# Patient Record
Sex: Female | Born: 1950 | Race: Black or African American | Hispanic: No | Marital: Single | Smoking: Former smoker
Health system: Southern US, Community
[De-identification: ages and names within clinical notes are randomized; demographics above are authoritative.]

## PROBLEM LIST (undated history)

## (undated) DIAGNOSIS — M48061 Spinal stenosis, lumbar region without neurogenic claudication: Secondary | ICD-10-CM

## (undated) DIAGNOSIS — I10 Essential (primary) hypertension: Secondary | ICD-10-CM

## (undated) DIAGNOSIS — J4540 Moderate persistent asthma, uncomplicated: Secondary | ICD-10-CM

## (undated) HISTORY — DX: Spinal stenosis, lumbar region without neurogenic claudication: M48.061

## (undated) HISTORY — DX: Essential (primary) hypertension: I10

## (undated) HISTORY — DX: Moderate persistent asthma, uncomplicated: J45.40

## (undated) HISTORY — PX: BUNIONECTOMY: SHX129

---

## 2008-05-01 HISTORY — PX: FOOT ARTHRODESIS, MODIFIED MCBRIDE: SUR52

## 2018-04-16 ENCOUNTER — Encounter: Payer: Self-pay | Admitting: "Endocrinology

## 2018-10-01 ENCOUNTER — Ambulatory Visit: Payer: Self-pay | Admitting: "Endocrinology

## 2018-10-17 ENCOUNTER — Ambulatory Visit (INDEPENDENT_AMBULATORY_CARE_PROVIDER_SITE_OTHER): Payer: Medicare Other | Admitting: "Endocrinology

## 2018-10-17 ENCOUNTER — Encounter: Payer: Self-pay | Admitting: "Endocrinology

## 2018-10-17 ENCOUNTER — Other Ambulatory Visit: Payer: Self-pay

## 2018-10-17 VITALS — BP 152/90 | HR 65 | Temp 97.7°F | Ht 64.5 in | Wt 182.8 lb

## 2018-10-17 DIAGNOSIS — E0590 Thyrotoxicosis, unspecified without thyrotoxic crisis or storm: Secondary | ICD-10-CM | POA: Diagnosis not present

## 2018-10-17 NOTE — Progress Notes (Signed)
Endocrinology Consult Note                                            10/17/2018, 4:35 PM   Subjective:    Patient ID: Lori Ellison, female    DOB: 06/26/1950, PCP Lori HeadingIsernia, James, MD   Past Medical History:  Diagnosis Date  . Hypertension    Past Surgical History:  Procedure Laterality Date  . FOOT ARTHRODESIS, MODIFIED MCBRIDE Bilateral 2010   Social History   Socioeconomic History  . Marital status: Single    Spouse name: Not on file  . Number of children: Not on file  . Years of education: Not on file  . Highest education level: Not on file  Occupational History  . Not on file  Social Needs  . Financial resource strain: Not on file  . Food insecurity    Worry: Not on file    Inability: Not on file  . Transportation needs    Medical: Not on file    Non-medical: Not on file  Tobacco Use  . Smoking status: Not on file  Substance and Sexual Activity  . Alcohol use: Not on file  . Drug use: Not on file  . Sexual activity: Not on file  Lifestyle  . Physical activity    Days per week: Not on file    Minutes per session: Not on file  . Stress: Not on file  Relationships  . Social Musicianconnections    Talks on phone: Not on file    Gets together: Not on file    Attends religious service: Not on file    Active member of club or organization: Not on file    Attends meetings of clubs or organizations: Not on file    Relationship status: Not on file  Other Topics Concern  . Not on file  Social History Narrative  . Not on file   History reviewed. No pertinent family history. Outpatient Encounter Medications as of 10/17/2018  Medication Sig  . amLODipine (NORVASC) 5 MG tablet Take 5 mg by mouth once.  . Calcium-Phosphorus-Vitamin D (CITRACAL +D3 PO) Take 650 mg/kg/day by mouth once.  . Cyanocobalamin (VITAMIN B 12) 500 MCG TABS Take 500 mg by mouth once.  Marland Kitchen. Fexofenadine HCl (ALLEGRA ALLERGY PO) Take 20 mg by mouth once.  . Garlic (GARLIQUE PO) Take 1,000 mg  by mouth once.  . Ginkgo Biloba 40 MG TABS Take 60 mg by mouth 2 (two) times daily.  . Glucosamine-Chondroit-Vit C-Mn (GLUCOSAMINE 1500 COMPLEX PO) Take 2,000 mg by mouth once.  . metoprolol succinate (TOPROL-XL) 25 MG 24 hr tablet Take 25 mg by mouth daily.  Marland Kitchen. omeprazole (PRILOSEC) 20 MG capsule Take 20 mg by mouth 2 (two) times a day.   No facility-administered encounter medications on file as of 10/17/2018.    ALLERGIES: Not on File  VACCINATION STATUS:  There is no immunization history on file for this patient.  HPI Lori Ellison is 68 y.o. female who presents today with a medical history as above. she is being seen in consultation for abnormal thyroid function tests requested by Lori HeadingIsernia, James, MD.  She is known to have abnormal thyroid function test with suppressed TSH at least from October 2019 at which time she was given low-dose of medication what appears to be methimazole briefly.  She has not taken  it for the last several month.  Her most recent thyroid function test from June 24, 2018 which was consistent with TSH still suppressed at 0.038, along with normal free T4 of 0.9 and total T3 of 132. -She denies palpitations, tremors, nor heat/cold intolerance. -She describes fatigue. -She has family history of an identified thyroid dysfunction in her daughter. -She denies any personal history of goiter nor family history of thyroid malignancy. -She denies dysphagia, shortness of breath, nor voice change. Review of Systems  Constitutional: no recent weight gain/loss, no fatigue, no subjective hyperthermia, no subjective hypothermia Eyes: no blurry vision, no xerophthalmia ENT: no sore throat, no nodules palpated in throat, no dysphagia/odynophagia, no hoarseness Cardiovascular: no Chest Pain, no Shortness of Breath, no palpitations, no leg swelling Respiratory: no cough, no shortness of breath Gastrointestinal: no Nausea/Vomiting/Diarhhea Musculoskeletal: no muscle/joint  aches Skin: no rashes Neurological: no tremors, no numbness, no tingling, no dizziness Psychiatric: no depression, no anxiety  Objective:    BP (!) 152/90   Pulse 65   Temp 97.7 F (36.5 C)   Ht 5' 4.5" (1.638 m)   Wt 182 lb 12.8 oz (82.9 kg)   SpO2 98%   BMI 30.89 kg/m   Wt Readings from Last 3 Encounters:  10/17/18 182 lb 12.8 oz (82.9 kg)    Physical Exam  Constitutional:  + Class I obesity, not in acute distress, normal state of mind Eyes: PERRLA, EOMI, no exophthalmos ENT: moist mucous membranes, no gross thyromegaly, no gross cervical lymphadenopathy Cardiovascular: normal precordial activity, Regular Rate and Rhythm, no Murmur/Rubs/Gallops Respiratory:  adequate breathing efforts, no gross chest deformity, Clear to auscultation bilaterally Gastrointestinal: abdomen soft, Non -tender, No distension, Bowel Sounds present, no gross organomegaly Musculoskeletal: no gross deformities, strength intact in all four extremities Skin: moist, warm, no rashes Neurological: no tremor with outstretched hands, Deep tendon reflexes normal in bilateral lower extremities.  -Thyroid function test: February 25, 2018 TSH 0-0 2 5 June 24, 2018 TSH 0038, free T4 0.9, total T3 132  Assessment & Plan:   1. Subclinical hyperthyroidism  - Lori Ellison  is being seen at a kind request of Lonia Mad, MD. - I have reviewed her available thyroid records and clinically evaluated the patient. - Based on these reviews, she has subclinical hyperthyroidism,  however,  there is not sufficient information to proceed with definitive treatment plan. -She will need more complete thyroid function tests today involving TSH, free T4/free T3, and antithyroid antibodies.  She will also need thyroid uptake and scan to complete the work-up. -she will return in 2 weeks to review her repeat labs.   - I did not initiate any new prescriptions today. - I advised her  to maintain close follow up with  Lonia Mad, MD for primary care needs.   - Time spent with the patient: 45 minutes, of which >50% was spent in obtaining information about her symptoms, reviewing her previous labs/studies,  evaluations, and treatments, counseling her about her subclinical hyperthyroidism, and developing a plan to confirm the diagnosis and long term treatment based on the latest standards of care/guidelines.    Aaron Edelman participated in the discussions, expressed understanding, and voiced agreement with the above plans.  All questions were answered to her satisfaction. she is encouraged to contact clinic should she have any questions or concerns prior to her return visit.  Follow up plan: Return in about 2 weeks (around 10/31/2018) for Labs Today- Non-Fasting Ok, Follow up with Thyroid Uptake and Scan.  Marquis LunchGebre Demetre Monaco, MD [facility] [address] [place], [place] [address] Phone: [phone]  Fax: [phone]     10/17/2018, 4:35 PM  This note was partially dictated with voice recognition software. Similar sounding words can be transcribed inadequately or may not  be corrected upon review.

## 2018-10-18 LAB — TSH: TSH: 0.02 mIU/L — ABNORMAL LOW (ref 0.40–4.50)

## 2018-10-18 LAB — T4, FREE: Free T4: 1.2 ng/dL (ref 0.8–1.8)

## 2018-10-18 LAB — T3, FREE: T3, Free: 3.9 pg/mL (ref 2.3–4.2)

## 2018-10-18 LAB — THYROGLOBULIN ANTIBODY: Thyroglobulin Ab: <1 IU/mL (ref <=1)

## 2018-10-18 LAB — THYROID PEROXIDASE ANTIBODY: Thyroperoxidase Ab SerPl-aCnc: <1 IU/mL (ref <9)

## 2018-10-30 ENCOUNTER — Encounter (HOSPITAL_COMMUNITY)
Admission: RE | Admit: 2018-10-30 | Discharge: 2018-10-30 | Disposition: A | Discharge status: Home / Self Care | Payer: Medicare Other | Source: Ambulatory Visit | Attending: "Endocrinology | Admitting: "Endocrinology

## 2018-10-30 ENCOUNTER — Other Ambulatory Visit: Payer: Self-pay

## 2018-10-30 DIAGNOSIS — E0590 Thyrotoxicosis, unspecified without thyrotoxic crisis or storm: Secondary | ICD-10-CM | POA: Insufficient documentation

## 2018-10-30 MED ORDER — SODIUM IODIDE I-123 7.4 MBQ CAPS
451.0000 | ORAL_CAPSULE | Freq: Once | ORAL | Status: AC
  Administered 2018-10-30: 451 via ORAL

## 2018-10-31 ENCOUNTER — Ambulatory Visit: Payer: Medicare Other | Admitting: "Endocrinology

## 2018-10-31 ENCOUNTER — Encounter (HOSPITAL_COMMUNITY)
Admission: RE | Admit: 2018-10-31 | Discharge: 2018-10-31 | Disposition: A | Discharge status: Home / Self Care | Payer: Medicare Other | Source: Ambulatory Visit | Attending: "Endocrinology | Admitting: "Endocrinology

## 2018-10-31 ENCOUNTER — Encounter (HOSPITAL_COMMUNITY): Payer: Self-pay

## 2018-11-22 ENCOUNTER — Other Ambulatory Visit: Payer: Self-pay

## 2018-11-22 ENCOUNTER — Encounter: Payer: Self-pay | Admitting: "Endocrinology

## 2018-11-22 ENCOUNTER — Encounter (INDEPENDENT_AMBULATORY_CARE_PROVIDER_SITE_OTHER): Payer: Self-pay

## 2018-11-22 ENCOUNTER — Ambulatory Visit (INDEPENDENT_AMBULATORY_CARE_PROVIDER_SITE_OTHER): Payer: Medicare Other | Admitting: "Endocrinology

## 2018-11-22 VITALS — BP 137/80 | HR 54 | Ht 64.5 in | Wt 186.0 lb

## 2018-11-22 DIAGNOSIS — E0520 Thyrotoxicosis with toxic multinodular goiter without thyrotoxic crisis or storm: Secondary | ICD-10-CM

## 2018-11-22 MED ORDER — METHIMAZOLE 5 MG PO TABS: 5.0000 mg | ORAL_TABLET | Freq: Every day | ORAL | 3 refills | Status: DC

## 2018-11-22 NOTE — Progress Notes (Signed)
11/22/2018, 11:24 AM  Endocrinology follow-up note   Subjective:    Patient ID: Lori JackGwendolyn Files, female    DOB: 09/03/1950, PCP Arma HeadingIsernia, James, MD   Past Medical History:  Diagnosis Date  . Hypertension   . Moderate persistent asthma   . Spinal stenosis at L4-L5 level    Past Surgical History:  Procedure Laterality Date  . BUNIONECTOMY    . FOOT ARTHRODESIS, MODIFIED MCBRIDE Bilateral 2010   Social History   Socioeconomic History  . Marital status: Single    Spouse name: Not on file  . Number of children: Not on file  . Years of education: Not on file  . Highest education level: Not on file  Occupational History  . Not on file  Social Needs  . Financial resource strain: Not on file  . Food insecurity    Worry: Not on file    Inability: Not on file  . Transportation needs    Medical: Not on file    Non-medical: Not on file  Tobacco Use  . Smoking status: Former Smoker    Types: Cigarettes    Quit date: 1980    Years since quitting: 40.5  . Smokeless tobacco: Never Used  Substance and Sexual Activity  . Alcohol use: Never    Frequency: Never  . Drug use: Never  . Sexual activity: Not on file  Lifestyle  . Physical activity    Days per week: Not on file    Minutes per session: Not on file  . Stress: Not on file  Relationships  . Social Musicianconnections    Talks on phone: Not on file    Gets together: Not on file    Attends religious service: Not on file    Active member of club or organization: Not on file    Attends meetings of clubs or organizations: Not on file    Relationship status: Not on file  Other Topics Concern  . Not on file  Social History Narrative  . Not on file   No family history on file. Outpatient Encounter Medications as of 11/22/2018  Medication Sig  . amLODipine (NORVASC) 5 MG tablet Take 5 mg by mouth once.  . Calcium-Phosphorus-Vitamin D (CITRACAL +D3 PO) Take 650 mg/kg/day by  mouth once.  . Cyanocobalamin (VITAMIN B 12) 500 MCG TABS Take 500 mg by mouth once.  Marland Kitchen. Fexofenadine HCl (ALLEGRA ALLERGY PO) Take 20 mg by mouth once.  . Garlic (GARLIQUE PO) Take 1,000 mg by mouth once.  . Ginkgo Biloba 40 MG TABS Take 60 mg by mouth 2 (two) times daily.  . Glucosamine-Chondroit-Vit C-Mn (GLUCOSAMINE 1500 COMPLEX PO) Take 2,000 mg by mouth once.  . methimazole (TAPAZOLE) 5 MG tablet Take 1 tablet (5 mg total) by mouth daily with breakfast.  . omeprazole (PRILOSEC) 20 MG capsule Take 20 mg by mouth 2 (two) times a day.  . [DISCONTINUED] metoprolol succinate (TOPROL-XL) 25 MG 24 hr tablet Take 25 mg by mouth daily.   No facility-administered encounter medications on file as of 11/22/2018.    ALLERGIES: Allergies  Allergen Reactions  . Penicillins Rash and Swelling    VACCINATION STATUS:  There is no immunization history on file for this patient.  HPI Lori Ellison is 68 y.o. female who presents today with her new thyroid function test and thyroid uptake and scan.    She is known to have abnormal thyroid function test with suppressed TSH at least from October 2019 at which time she was given low-dose of medication what appears to be methimazole briefly.  She reports that she thought this medication caused her tingling in her legs and stopped it.  -She reports increasing heat intolerance, and her pressures.  -She describes fatigue. -She has family history of  unidentified thyroid dysfunction in her daughter. -She denies any personal history of goiter nor family history of thyroid malignancy. -She denies dysphagia, shortness of breath, nor voice change.   Review of Systems  Constitutional: + Gained 4 pounds since last visit,  no fatigue, no subjective hyperthermia, no subjective hypothermia Eyes: no blurry vision, no xerophthalmia ENT: no sore throat, no nodules palpated in throat, no dysphagia/odynophagia, no hoarseness Musculoskeletal: no muscle/joint  aches Skin: no rashes Neurological: no tremors, no numbness, no tingling, no dizziness Psychiatric: no depression, no anxiety  Objective:    BP 137/80   Pulse (!) 54   Ht 5' 4.5" (1.638 m)   Wt 186 lb (84.4 kg)   BMI 31.43 kg/m   Wt Readings from Last 3 Encounters:  11/22/18 186 lb (84.4 kg)  10/17/18 182 lb 12.8 oz (82.9 kg)    Physical Exam  Constitutional:  + Class I obesity, not in acute distress, normal state of mind Eyes: PERRLA, EOMI, no exophthalmos ENT: moist mucous membranes, no gross thyromegaly, no gross cervical lymphadenopathy Musculoskeletal: no gross deformities, strength intact in all four extremities Skin: moist, warm, no rashes Neurological: no tremor with outstretched hands, Deep tendon reflexes normal in bilateral lower extremities.  -Thyroid function test: February 25, 2018 TSH 0-0 2 5 June 24, 2018 TSH 0038, free T4 0.9, total T3 132   FINDINGS: Heterogeneous uptake within the thyroid gland. There are multiple warm nodules. No discrete cold nodules evident.  4 hour I-123 uptake = 16.2% (normal 5-20%). 24 hour I-123 uptake = 38.1% (normal 10-30%)  IMPRESSION: 1. Multinodular thyroid gland. 2. Moderately elevated 24 hour iodine uptake and depressed TSH. 3. Findings most with consistent toxic multinodular goiter.  Recent Results (from the past 2160 hour(s))  TSH     Status: Abnormal   Collection Time: 10/17/18 11:43 AM  Result Value Ref Range   TSH 0.02 (L) 0.40 - 4.50 mIU/L  T4, free     Status: None   Collection Time: 10/17/18 11:43 AM  Result Value Ref Range   Free T4 1.2 0.8 - 1.8 ng/dL  T3, free     Status: None   Collection Time: 10/17/18 11:43 AM  Result Value Ref Range   T3, Free 3.9 2.3 - 4.2 pg/mL  Thyroid peroxidase antibody     Status: None   Collection Time: 10/17/18 11:43 AM  Result Value Ref Range   Thyroperoxidase Ab SerPl-aCnc <1 <9 IU/mL  Thyroglobulin antibody     Status: None   Collection Time: 10/17/18 11:43 AM   Result Value Ref Range   Thyroglobulin Ab <1 < or = 1 IU/mL    Assessment & Plan:   1.  Toxic multinodular goiter  -Her work-up confirms clinically significant primary hyperthyroidism, from toxic multinodular goiter.  Given her symptomatic presentation, she will benefit from treatment. -Options of treatment were discussed with her including I-131 thyroid ablation.  However, she wishes to avoid ablative treatment for now. She  is willing to retry Tapazole at a low-dose before considering ablative treatment with I-131. -I discussed and prescribed Tapazole 5 mg daily at breakfast and plan to repeat thyroid function test in 3 months.  If she continues to have intolerance of Tapazole or not satisfactory response, she will be offered the next option of treatment which is ablative I-131 therapy.  -she will return in 2 weeks to review her repeat labs.  - I advised her  to maintain close follow up with Arma HeadingIsernia, James, MD for primary care needs.   Time for this visit: 15 minutes. Lori Ellison  participated in the discussions, expressed understanding, and voiced agreement with the above plans.  All questions were answered to her satisfaction. she is encouraged to contact clinic should she have any questions or concerns prior to her return visit.   Follow up plan: Return in about 3 months (around 02/22/2019) for Follow up with Pre-visit Labs.   Marquis LunchGebre Kaylana Fenstermacher, MD [facility] [address] [place], [place] [address] Phone: [phone]  Fax: [phone]     11/22/2018, 11:24 AM  This note was partially dictated with voice recognition software. Similar sounding words can be transcribed inadequately or may not  be corrected upon review.

## 2018-11-25 ENCOUNTER — Telehealth: Payer: Self-pay | Admitting: "Endocrinology

## 2018-11-25 NOTE — Telephone Encounter (Signed)
Patient said she had a visit on Friday and thought that Dr Dorris Fetch was going to call her in something else for her thyroid instead of Methimazole. She said this one has to many side effects. Please advise.

## 2018-11-25 NOTE — Telephone Encounter (Signed)
Routing to Dr Nida for Advice? 

## 2018-11-25 NOTE — Telephone Encounter (Signed)
No, what we discussed was that to avoid I-131 thyroid ablation ( freezing)  she was willing to retry methimazole.  Methimazole is still the best option of spinal thyroid ablation ( freezing).  I still want her to try it again.

## 2018-11-25 NOTE — Telephone Encounter (Signed)
She is aware of the recommendation, she states she was taking the Methimazole now anyway

## 2019-02-13 ENCOUNTER — Other Ambulatory Visit: Payer: Self-pay | Admitting: "Endocrinology

## 2019-02-14 LAB — T4, FREE: Free T4: 0.8 ng/dL (ref 0.8–1.8)

## 2019-02-14 LAB — TSH: TSH: 1.67 mIU/L (ref 0.40–4.50)

## 2019-02-14 LAB — T3, FREE: T3, Free: 3.3 pg/mL (ref 2.3–4.2)

## 2019-02-25 ENCOUNTER — Other Ambulatory Visit: Payer: Self-pay

## 2019-02-25 ENCOUNTER — Encounter: Payer: Self-pay | Admitting: "Endocrinology

## 2019-02-25 ENCOUNTER — Ambulatory Visit (INDEPENDENT_AMBULATORY_CARE_PROVIDER_SITE_OTHER): Payer: Medicare Other | Admitting: "Endocrinology

## 2019-02-25 DIAGNOSIS — E0520 Thyrotoxicosis with toxic multinodular goiter without thyrotoxic crisis or storm: Secondary | ICD-10-CM

## 2019-02-25 MED ORDER — METHIMAZOLE 5 MG PO TABS: 2.5000 mg | ORAL_TABLET | Freq: Every day | ORAL | 1 refills | Status: DC

## 2019-02-25 NOTE — Progress Notes (Signed)
02/25/2019, 1:35 PM                                 Endocrinology Telehealth Visit Follow up Note -During COVID -19 Pandemic  I connected with Lori JackGwendolyn Eades on 02/25/2019   by telephone and verified that I am speaking with the correct person using two identifiers. Lori JackGwendolyn Robb, 04/15/1951. she has verbally consented to this visit. All issues noted in this document were discussed and addressed. The format was not optimal for physical exam.    Subjective:    Patient ID: Lori Ellison, female    DOB: 04/24/1951, PCP Arma HeadingIsernia, James, MD   Past Medical History:  Diagnosis Date  . Hypertension   . Moderate persistent asthma   . Spinal stenosis at L4-L5 level    Past Surgical History:  Procedure Laterality Date  . BUNIONECTOMY    . FOOT ARTHRODESIS, MODIFIED MCBRIDE Bilateral 2010   Social History   Socioeconomic History  . Marital status: Single    Spouse name: Not on file  . Number of children: Not on file  . Years of education: Not on file  . Highest education level: Not on file  Occupational History  . Not on file  Social Needs  . Financial resource strain: Not on file  . Food insecurity    Worry: Not on file    Inability: Not on file  . Transportation needs    Medical: Not on file    Non-medical: Not on file  Tobacco Use  . Smoking status: Former Smoker    Types: Cigarettes    Quit date: 1980    Years since quitting: 40.8  . Smokeless tobacco: Never Used  Substance and Sexual Activity  . Alcohol use: Never    Frequency: Never  . Drug use: Never  . Sexual activity: Not on file  Lifestyle  . Physical activity    Days per week: Not on file    Minutes per session: Not on file  . Stress: Not on file  Relationships  . Social Musicianconnections    Talks on phone: Not on file    Gets together: Not on file    Attends religious service: Not on file    Active member of club or organization: Not on file    Attends  meetings of clubs or organizations: Not on file    Relationship status: Not on file  Other Topics Concern  . Not on file  Social History Narrative  . Not on file   History reviewed. No pertinent family history. Outpatient Encounter Medications as of 02/25/2019  Medication Sig  . amLODipine (NORVASC) 5 MG tablet Take 5 mg by mouth once.  . Calcium-Phosphorus-Vitamin D (CITRACAL +D3 PO) Take 650 mg/kg/day by mouth once.  . Cyanocobalamin (VITAMIN B 12) 500 MCG TABS Take 500 mg by mouth once.  Marland Kitchen. Fexofenadine HCl (ALLEGRA ALLERGY PO) Take 20 mg by mouth once.  . Garlic (GARLIQUE PO) Take 1,000 mg by mouth once.  . Ginkgo Biloba 40 MG TABS Take 60 mg by mouth 2 (two) times daily.  . Glucosamine-Chondroit-Vit C-Mn (GLUCOSAMINE 1500 COMPLEX PO) Take 2,000 mg by mouth once.  .Marland Kitchen  methimazole (TAPAZOLE) 5 MG tablet Take 0.5 tablets (2.5 mg total) by mouth daily.  . metoprolol tartrate (LOPRESSOR) 25 MG tablet Take 25 mg by mouth 2 (two) times daily.  Marland Kitchen omeprazole (PRILOSEC) 20 MG capsule Take 20 mg by mouth 2 (two) times a day.  . [DISCONTINUED] methimazole (TAPAZOLE) 5 MG tablet TAKE 1 TABLET BY MOUTH EVERY DAY WITH BREAKFAST   No facility-administered encounter medications on file as of 02/25/2019.    ALLERGIES: Allergies  Allergen Reactions  . Penicillins Rash and Swelling    VACCINATION STATUS:  There is no immunization history on file for this patient.  HPI Lori Ellison is 68 y.o. female who was  initiated on low-dose methimazole for hyperthyroidism from toxic multinodular goiter, in being reengaged in telehealth for follow-up with repeat thyroid function test.   -Reports feeling better, however continues to have hot flashes.  Denies palpitations, no major weight change. Her previsit thyroid function tests are consistent with treatment response.  Prior to treatment her thyroid uptake and scan was 30%. -She reports increasing heat intolerance, and her pressures. She has  family  history of  unidentified thyroid dysfunction in her daughter. -She denies any personal history of goiter nor family history of thyroid malignancy. -She denies dysphagia, shortness of breath, nor voice change.   Review of Systems Limited as above.  Objective:    There were no vitals taken for this visit.  Wt Readings from Last 3 Encounters:  11/22/18 186 lb (84.4 kg)  10/17/18 182 lb 12.8 oz (82.9 kg)    Physical Exam    -Thyroid function test: February 25, 2018 TSH 0-0 2 5 June 24, 2018 TSH 0038, free T4 0.9, total T3 132   FINDINGS: Heterogeneous uptake within the thyroid gland. There are multiple warm nodules. No discrete cold nodules evident.  4 hour I-123 uptake = 16.2% (normal 5-20%). 24 hour I-123 uptake = 38.1% (normal 10-30%)  IMPRESSION: 1. Multinodular thyroid gland. 2. Moderately elevated 24 hour iodine uptake and depressed TSH. 3. Findings most with consistent toxic multinodular goiter.  Recent Results (from the past 2160 hour(s))  TSH     Status: None   Collection Time: 02/13/19  1:23 PM  Result Value Ref Range   TSH 1.67 0.40 - 4.50 mIU/L  T4, free     Status: None   Collection Time: 02/13/19  1:23 PM  Result Value Ref Range   Free T4 0.8 0.8 - 1.8 ng/dL  T3, free     Status: None   Collection Time: 02/13/19  1:23 PM  Result Value Ref Range   T3, Free 3.3 2.3 - 4.2 pg/mL    Assessment & Plan:   1.  Toxic multinodular goiter  -Her previsit labs show consistent response to treatment. -Given her thyroid uptake of 38%, options of treatment were discussed with her including I-131 thyroid ablation.  However, she wishes to avoid ablative treatment for now. She is advised to lower methimazole to 2.5 mg p.o. daily at breakfast.  -She will have repeat thyroid function test and office visit in 3 months.  If her response is found to be unsatisfactory, she would be reapproached with I-131 thyroid ablation.  - I advised her  to maintain close follow up  with Arma Heading, MD for primary care needs.  Time for this visit: 15 minutes. Lori Jack  participated in the discussions, expressed understanding, and voiced agreement with the above plans.  All questions were answered to her satisfaction. she is encouraged to  contact clinic should she have any questions or concerns prior to her return visit.  Follow up plan: Return in about 3 months (around 05/28/2019) for Follow up with Pre-visit Labs.   Glade Lloyd, MD [facility] [address] [place], [place] [address] Phone: [phone]  Fax: [phone]     02/25/2019, 1:35 PM  This note was partially dictated with voice recognition software. Similar sounding words can be transcribed inadequately or may not  be corrected upon review.

## 2019-05-14 LAB — T3, FREE: T3, Free: 3.4 pg/mL (ref 2.3–4.2)

## 2019-05-14 LAB — T4, FREE: Free T4: 1 ng/dL (ref 0.8–1.8)

## 2019-05-14 LAB — TSH: TSH: 1.03 mIU/L (ref 0.40–4.50)

## 2019-05-29 ENCOUNTER — Encounter: Payer: Self-pay | Admitting: "Endocrinology

## 2019-05-29 ENCOUNTER — Ambulatory Visit (INDEPENDENT_AMBULATORY_CARE_PROVIDER_SITE_OTHER): Payer: Medicare Other | Admitting: "Endocrinology

## 2019-05-29 DIAGNOSIS — E0520 Thyrotoxicosis with toxic multinodular goiter without thyrotoxic crisis or storm: Secondary | ICD-10-CM | POA: Diagnosis not present

## 2019-05-29 NOTE — Progress Notes (Signed)
05/29/2019, 1:56 PM                                     Endocrinology Telehealth Visit Follow up Note -During COVID -19 Pandemic  I connected with Lori Ellison on 05/29/2019   by telephone and verified that I am speaking with the correct person using two identifiers. Lori Ellison, 1951/04/30. she has verbally consented to this visit. All issues noted in this document were discussed and addressed. The format was not optimal for physical exam.   Subjective:    Patient ID: Lori Ellison, female    DOB: 02-14-1951, PCP Arma Heading, MD   Past Medical History:  Diagnosis Date  . Hypertension   . Moderate persistent asthma   . Spinal stenosis at L4-L5 level    Past Surgical History:  Procedure Laterality Date  . BUNIONECTOMY    . FOOT ARTHRODESIS, MODIFIED MCBRIDE Bilateral 2010   Social History   Socioeconomic History  . Marital status: Single    Spouse name: Not on file  . Number of children: Not on file  . Years of education: Not on file  . Highest education level: Not on file  Occupational History  . Not on file  Tobacco Use  . Smoking status: Former Smoker    Types: Cigarettes    Quit date: 1980    Years since quitting: 41.1  . Smokeless tobacco: Never Used  Substance and Sexual Activity  . Alcohol use: Never  . Drug use: Never  . Sexual activity: Not on file  Other Topics Concern  . Not on file  Social History Narrative  . Not on file   Social Determinants of Health   Financial Resource Strain:   . Difficulty of Paying Living Expenses: Not on file  Food Insecurity:   . Worried About Programme researcher, broadcasting/film/video in the Last Year: Not on file  . Ran Out of Food in the Last Year: Not on file  Transportation Needs:   . Lack of Transportation (Medical): Not on file  . Lack of Transportation (Non-Medical): Not on file  Physical Activity:   . Days of Exercise per Week: Not on file  . Minutes of Exercise per  Session: Not on file  Stress:   . Feeling of Stress : Not on file  Social Connections:   . Frequency of Communication with Friends and Family: Not on file  . Frequency of Social Gatherings with Friends and Family: Not on file  . Attends Religious Services: Not on file  . Active Member of Clubs or Organizations: Not on file  . Attends Banker Meetings: Not on file  . Marital Status: Not on file   History reviewed. No pertinent family history. Outpatient Encounter Medications as of 05/29/2019  Medication Sig  . amLODipine (NORVASC) 5 MG tablet Take 5 mg by mouth once.  . Calcium-Phosphorus-Vitamin D (CITRACAL +D3 PO) Take 650 mg/kg/day by mouth once.  . Cyanocobalamin (VITAMIN B 12) 500 MCG TABS Take 500 mg by mouth once.  Marland Kitchen Fexofenadine HCl (ALLEGRA ALLERGY PO) Take 20 mg by mouth once.  . Garlic (GARLIQUE PO) Take 1,000  mg by mouth once.  . Ginkgo Biloba 40 MG TABS Take 60 mg by mouth 2 (two) times daily.  . Glucosamine-Chondroit-Vit C-Mn (GLUCOSAMINE 1500 COMPLEX PO) Take 2,000 mg by mouth once.  . metoprolol tartrate (LOPRESSOR) 25 MG tablet Take 25 mg by mouth 2 (two) times daily.  Marland Kitchen omeprazole (PRILOSEC) 20 MG capsule Take 20 mg by mouth 2 (two) times a day.  . [DISCONTINUED] methimazole (TAPAZOLE) 5 MG tablet Take 0.5 tablets (2.5 mg total) by mouth daily.   No facility-administered encounter medications on file as of 05/29/2019.   ALLERGIES: Allergies  Allergen Reactions  . Penicillins Rash and Swelling    VACCINATION STATUS:  There is no immunization history on file for this patient.  HPI Lori Ellison is 69 y.o. 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   -Reports feeling better.  Denies palpitations, no major weight change.  Continues to lose hair. Her previsit thyroid function tests are consistent with treatment  response.  Prior to treatment her thyroid uptake and scan was 30%. -She reports increasing heat intolerance, and her pressures. She has  family history of  unidentified thyroid dysfunction in her daughter. -She denies any personal history of goiter nor family history of thyroid malignancy. -She denies dysphagia, shortness of breath, nor voice change.   Review of Systems Limited as above.  Objective:    There were no vitals taken for this visit.  Wt Readings from Last 3 Encounters:  11/22/18 186 lb (84.4 kg)  10/17/18 182 lb 12.8 oz (82.9 kg)    Physical Exam    -Thyroid function test: February 25, 2018 TSH 0-0 2 5 June 24, 2018 TSH 0038, free T4 0.9, total T3 132   FINDINGS: Heterogeneous uptake within the thyroid gland. There are multiple warm nodules. No discrete cold nodules evident.  4 hour I-123 uptake = 16.2% (normal 5-20%). 24 hour I-123 uptake = 38.1% (normal 10-30%)  IMPRESSION: 1. Multinodular thyroid gland. 2. Moderately elevated 24 hour iodine uptake and depressed TSH. 3. Findings most with consistent toxic multinodular goiter.  Recent Results (from the past 2160 hour(s))  TSH     Status: None   Collection Time: 05/14/19  3:15 PM  Result Value Ref Range   TSH 1.03 0.40 - 4.50 mIU/L  T4, free     Status: None   Collection Time: 05/14/19  3:15 PM  Result Value Ref Range   Free T4 1.0 0.8 - 1.8 ng/dL  T3, free     Status: None   Collection Time: 05/14/19  3:15 PM  Result Value Ref Range   T3, Free 3.4 2.3 - 4.2 pg/mL    Assessment & Plan:   1.  Toxic multinodular goiter  -Her previsit labs show consistent response to treatment.  At this time, she will be taken off of methimazole with plan to repeat thyroid function test in 4 months. -Given her thyroid uptake of 38%.  If her response is found to be unsatisfactory, she would be reapproached with option for I-131 thyroid ablation.  - I advised her  to maintain close follow up with Lonia Mad,  MD for primary care needs.     - Time spent on this patient care encounter:  20 minutes of which 50% was spent in  counseling and the rest reviewing  her current and  previous labs / studies and medications  doses and developing  a plan for long term care. Lori Ellison  participated in the discussions, expressed understanding, and voiced agreement with the above plans.  All questions were answered to her satisfaction. she is encouraged to contact clinic should she have any questions or concerns prior to her return visit.   Follow up plan: Return in about 4 months (around 09/26/2019) for Follow up with Pre-visit Labs.   Marquis Lunch, MD [facility] [address] [place], [place] [address] Phone: [phone]  Fax: [phone]     05/29/2019, 1:56 PM  This note was partially dictated with voice recognition software. Similar sounding words can be transcribed inadequately or may not  be corrected upon review.

## 2019-09-18 ENCOUNTER — Other Ambulatory Visit: Payer: Self-pay | Admitting: "Endocrinology

## 2019-09-18 DIAGNOSIS — E0520 Thyrotoxicosis with toxic multinodular goiter without thyrotoxic crisis or storm: Secondary | ICD-10-CM

## 2019-09-18 LAB — T3, FREE: T3, Free: 4.6 pg/mL — ABNORMAL HIGH (ref 2.3–4.2)

## 2019-09-18 LAB — T4, FREE: Free T4: 1.3 ng/dL (ref 0.8–1.8)

## 2019-09-18 LAB — TSH: TSH: 0.01 mIU/L — ABNORMAL LOW (ref 0.40–4.50)

## 2019-09-30 ENCOUNTER — Encounter (HOSPITAL_COMMUNITY)
Admission: RE | Admit: 2019-09-30 | Discharge: 2019-09-30 | Disposition: A | Discharge status: Home / Self Care | Payer: Medicare HMO | Source: Ambulatory Visit | Attending: "Endocrinology | Admitting: "Endocrinology

## 2019-09-30 ENCOUNTER — Other Ambulatory Visit: Payer: Self-pay

## 2019-09-30 ENCOUNTER — Ambulatory Visit: Payer: 59 | Admitting: "Endocrinology

## 2019-09-30 DIAGNOSIS — E0520 Thyrotoxicosis with toxic multinodular goiter without thyrotoxic crisis or storm: Secondary | ICD-10-CM | POA: Diagnosis present

## 2019-09-30 MED ORDER — SODIUM IODIDE I-123 7.4 MBQ CAPS
301.0000 | ORAL_CAPSULE | Freq: Once | ORAL | Status: AC
  Administered 2019-09-30: 301 via ORAL

## 2019-10-01 ENCOUNTER — Encounter (HOSPITAL_COMMUNITY)
Admission: RE | Admit: 2019-10-01 | Discharge: 2019-10-01 | Disposition: A | Discharge status: Home / Self Care | Payer: Medicare HMO | Source: Ambulatory Visit | Attending: "Endocrinology | Admitting: "Endocrinology

## 2019-10-10 ENCOUNTER — Encounter: Payer: Self-pay | Admitting: "Endocrinology

## 2019-10-10 ENCOUNTER — Ambulatory Visit (INDEPENDENT_AMBULATORY_CARE_PROVIDER_SITE_OTHER): Payer: Medicare HMO | Admitting: "Endocrinology

## 2019-10-10 ENCOUNTER — Other Ambulatory Visit: Payer: Self-pay

## 2019-10-10 VITALS — BP 152/80 | HR 59 | Ht 64.5 in | Wt 192.0 lb

## 2019-10-10 DIAGNOSIS — E0520 Thyrotoxicosis with toxic multinodular goiter without thyrotoxic crisis or storm: Secondary | ICD-10-CM

## 2019-10-10 NOTE — Progress Notes (Signed)
10/10/2019, 11:15 AM  Endocrinology follow-up note                   Subjective:    Patient ID: Lori Ellison, female    DOB: 1951/04/13, PCP Arma Heading, MD   Past Medical History:  Diagnosis Date  . Hypertension   . Moderate persistent asthma   . Spinal stenosis at L4-L5 level    Past Surgical History:  Procedure Laterality Date  . BUNIONECTOMY    . FOOT ARTHRODESIS, MODIFIED MCBRIDE Bilateral 2010   Social History   Socioeconomic History  . Marital status: Single    Spouse name: Not on file  . Number of children: Not on file  . Years of education: Not on file  . Highest education level: Not on file  Occupational History  . Not on file  Tobacco Use  . Smoking status: Former Smoker    Types: Cigarettes    Quit date: 1980    Years since quitting: 41.4  . Smokeless tobacco: Never Used  Vaping Use  . Vaping Use: Never used  Substance and Sexual Activity  . Alcohol use: Never  . Drug use: Never  . Sexual activity: Not on file  Other Topics Concern  . Not on file  Social History Narrative  . Not on file   Social Determinants of Health   Financial Resource Strain:   . Difficulty of Paying Living Expenses:   Food Insecurity:   . Worried About Programme researcher, broadcasting/film/video in the Last Year:   . Barista in the Last Year:   Transportation Needs:   . Freight forwarder (Medical):   Marland Kitchen Lack of Transportation (Non-Medical):   Physical Activity:   . Days of Exercise per Week:   . Minutes of Exercise per Session:   Stress:   . Feeling of Stress :   Social Connections:   . Frequency of Communication with Friends and Family:   . Frequency of Social Gatherings with Friends and Family:   . Attends Religious Services:   . Active Member of Clubs or Organizations:   . Attends Banker Meetings:   Marland Kitchen Marital Status:    History reviewed. No pertinent family history. Outpatient Encounter Medications  as of 10/10/2019  Medication Sig  . Apoaequorin (PREVAGEN PO) Take 1 tablet by mouth daily.  Marland Kitchen aspirin EC 81 MG tablet Take 81 mg by mouth daily. Swallow whole.  . ferrous sulfate 325 (65 FE) MG tablet Take 325 mg by mouth daily with breakfast.  . Multiple Vitamins-Minerals (HAIR SKIN AND NAILS FORMULA PO) Take 2 each by mouth daily.  . vitamin E (VITAMIN E) 180 MG (400 UNITS) capsule Take 400 Units by mouth daily.  Marland Kitchen amLODipine (NORVASC) 5 MG tablet Take 5 mg by mouth once.  . Calcium-Phosphorus-Vitamin D (CITRACAL +D3 PO) Take 650 mg/kg/day by mouth once.  . Cyanocobalamin (VITAMIN B 12) 500 MCG TABS Take 500 mg by mouth once.  Marland Kitchen Fexofenadine HCl (ALLEGRA ALLERGY PO) Take 20 mg by mouth once.  . Garlic (GARLIQUE PO) Take 1,000 mg by mouth once.  . Ginkgo Biloba 40 MG TABS Take 60 mg by mouth 2 (two) times daily.  Marland Kitchen  Glucosamine-Chondroit-Vit C-Mn (GLUCOSAMINE 1500 COMPLEX PO) Take 2,000 mg by mouth once.  . metoprolol tartrate (LOPRESSOR) 25 MG tablet Take 25 mg by mouth 2 (two) times daily.  Marland Kitchen omeprazole (PRILOSEC) 20 MG capsule Take 20 mg by mouth 2 (two) times a day.   No facility-administered encounter medications on file as of 10/10/2019.   ALLERGIES: Allergies  Allergen Reactions  . Penicillins Rash and Swelling    VACCINATION STATUS:  There is no immunization history on file for this patient.  HPI Lori Ellison is 69 y.o. 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  She was taken off of methimazole after adequate response.  More recently , she started to have symptoms including palpitations , hot flashes.    Her previsit thyroid function tests are consistent with relapse of hyperthyroidism.  Her subsequent thyroid uptake and scan confirmed inhomogeneous tracer distribution in both thyroid lobes consistent with multinodular goiter.   She has  family history of  unidentified thyroid dysfunction in her  daughter. -She denies any personal history of goiter nor family history of thyroid malignancy. -She denies dysphagia, shortness of breath, nor voice change.   Review of Systems Limited as above.  Objective:    BP (!) 152/80   Pulse (!) 59   Ht 5' 4.5" (1.638 m)   Wt 192 lb (87.1 kg)   BMI 32.45 kg/m   Wt Readings from Last 3 Encounters:  10/10/19 192 lb (87.1 kg)  11/22/18 186 lb (84.4 kg)  10/17/18 182 lb 12.8 oz (82.9 kg)    Physical Exam   Physical Exam- Limited  Constitutional:  Body mass index is 32.45 kg/m. , not in acute distress, normal state of mind Eyes:  EOMI, no exophthalmos Neck: Supple Thyroid: + Hypothyroid, no  gross goiter Respiratory: Adequate breathing efforts Musculoskeletal: no gross deformities, strength intact in all four extremities, no gross restriction of joint movements Skin:  no rashes, no hyperemia Neurological: no tremor with outstretched hands,    -Thyroid function test: February 25, 2018 TSH 0-0 2 5 June 24, 2018 TSH 0038, free T4 0.9, total T3 132  October 31, 2018 thyroid uptake and scan FINDINGS: Heterogeneous uptake within the thyroid gland. There are multiple warm nodules. No discrete cold nodules evident.  4 hour I-123 uptake = 16.2% (normal 5-20%). 24 hour I-123 uptake = 38.1% (normal 10-30%)  IMPRESSION: 1. Multinodular thyroid gland. 2. Moderately elevated 24 hour iodine uptake and depressed TSH. 3. Findings most with consistent toxic multinodular goiter.  Repeat thyroid uptake and scan on October 01, 2019 FINDINGS: Inhomogeneous tracer distribution in both thyroid lobes demonstrating multiple areas of increased and decreased tracer uptake consistent with a multinodular thyroid gland.  No dominant mass.  4 hour I-123 uptake = 14% (normal 5-20%)  24 hour I-123 uptake = 30% (normal 10-30%)  IMPRESSION: Multinodular thyroid gland.  Borderline elevated 24 hour radio iodine uptake of 30%, slightly decreased in  a 38% on the previous exam.  Recent Results (from the past 2160 hour(s))  TSH     Status: Abnormal   Collection Time: 09/17/19  1:12 PM  Result Value Ref Range   TSH 0.01 (L) 0.40 - 4.50 mIU/L  T4, free     Status: None   Collection Time: 09/17/19  1:12 PM  Result Value Ref Range   Free T4 1.3 0.8 - 1.8 ng/dL  T3, free     Status: Abnormal   Collection Time: 09/17/19  1:12 PM  Result Value Ref Range   T3, Free 4.6 (H) 2.3 - 4.2 pg/mL    Assessment & Plan:   1.  Toxic multinodular goiter  -Her previsit labs show consistent with T3 toxicosis.  She would benefit from definitive antithyroid intervention with radioactive iodine ablation.    -Patient is made aware of the high likelihood of post ablative hypothyroidism with subsequent need for lifelong thyroid hormone replacement. She understands this outcome and is willing to proceed.  Although surgery is one other choice of treatment in some cases, in her case surgery is not a good fit for presentation with only mild goiter.     - I advised her  to maintain close follow up with Arma Heading, MD for primary care needs.     - Time spent on this patient care encounter:  20 minutes of which 50% was spent in  counseling and the rest reviewing  her current and  previous labs / studies and medications  doses and developing a plan for long term care. Lori Ellison  participated in the discussions, expressed understanding, and voiced agreement with the above plans.  All questions were answered to her satisfaction. she is encouraged to contact clinic should she have any questions or concerns prior to her return visit.   Follow up plan: Return in about 9 weeks (around 12/12/2019) for F/U with Labs after I131 Therapy.   Marquis Lunch, MD [facility] [address] [place], [place] [address] Phone: [phone]  Fax: [phone]     10/10/2019, 11:15 AM  This note was partially  dictated with voice recognition software. Similar sounding words can be transcribed inadequately or may not  be corrected upon review.

## 2019-10-22 ENCOUNTER — Encounter (HOSPITAL_COMMUNITY): Payer: Medicare HMO

## 2019-10-24 ENCOUNTER — Ambulatory Visit (HOSPITAL_COMMUNITY): Payer: Medicare HMO

## 2019-10-28 ENCOUNTER — Ambulatory Visit (HOSPITAL_COMMUNITY)
Admission: RE | Admit: 2019-10-28 | Discharge: 2019-10-28 | Disposition: A | Discharge status: Home / Self Care | Payer: Medicare HMO | Source: Ambulatory Visit | Attending: "Endocrinology | Admitting: "Endocrinology

## 2019-10-28 ENCOUNTER — Other Ambulatory Visit: Payer: Self-pay

## 2019-10-28 DIAGNOSIS — E0520 Thyrotoxicosis with toxic multinodular goiter without thyrotoxic crisis or storm: Secondary | ICD-10-CM | POA: Diagnosis present

## 2019-10-28 MED ORDER — SODIUM IODIDE I 131 CAPSULE
30.0000 | Freq: Once | INTRAVENOUS | Status: AC | PRN
  Administered 2019-10-28: 30.7 via ORAL

## 2019-12-11 LAB — T4, FREE: Free T4: 1.4 ng/dL (ref 0.8–1.8)

## 2019-12-11 LAB — T3, FREE: T3, Free: 4.2 pg/mL (ref 2.3–4.2)

## 2019-12-11 LAB — TSH: TSH: <0.01 mIU/L — ABNORMAL LOW (ref 0.40–4.50)

## 2019-12-17 ENCOUNTER — Other Ambulatory Visit: Payer: Self-pay

## 2019-12-17 ENCOUNTER — Encounter: Payer: Self-pay | Admitting: "Endocrinology

## 2019-12-17 ENCOUNTER — Ambulatory Visit (INDEPENDENT_AMBULATORY_CARE_PROVIDER_SITE_OTHER): Payer: Medicare HMO | Admitting: "Endocrinology

## 2019-12-17 VITALS — BP 120/78 | HR 64 | Ht 64.5 in | Wt 190.0 lb

## 2019-12-17 DIAGNOSIS — E0520 Thyrotoxicosis with toxic multinodular goiter without thyrotoxic crisis or storm: Secondary | ICD-10-CM

## 2019-12-17 NOTE — Progress Notes (Signed)
12/17/2019, 12:54 PM  Endocrinology follow-up note                   Subjective:    Patient ID: Lori Ellison, female    DOB: 08-Jun-1950, PCP Lori Heading, MD   Past Medical History:  Diagnosis Date   Hypertension    Moderate persistent asthma    Spinal stenosis at L4-L5 level    Past Surgical History:  Procedure Laterality Date   BUNIONECTOMY     FOOT ARTHRODESIS, MODIFIED MCBRIDE Bilateral 2010   Social History   Socioeconomic History   Marital status: Single    Spouse name: Not on file   Number of children: Not on file   Years of education: Not on file   Highest education level: Not on file  Occupational History   Not on file  Tobacco Use   Smoking status: Former Smoker    Types: Cigarettes    Quit date: 1980    Years since quitting: 41.6   Smokeless tobacco: Never Used  Building services engineer Use: Never used  Substance and Sexual Activity   Alcohol use: Never   Drug use: Never   Sexual activity: Not on file  Other Topics Concern   Not on file  Social History Narrative   Not on file   Social Determinants of Health   Financial Resource Strain:    Difficulty of Paying Living Expenses:   Food Insecurity:    Worried About Programme researcher, broadcasting/film/video in the Last Year:    Barista in the Last Year:   Transportation Needs:    Freight forwarder (Medical):    Lack of Transportation (Non-Medical):   Physical Activity:    Days of Exercise per Week:    Minutes of Exercise per Session:   Stress:    Feeling of Stress :   Social Connections:    Frequency of Communication with Friends and Family:    Frequency of Social Gatherings with Friends and Family:    Attends Religious Services:    Active Member of Clubs or Organizations:    Attends Banker Meetings:    Marital Status:    History reviewed. No pertinent family history. Outpatient Encounter Medications  as of 12/17/2019  Medication Sig   amLODipine (NORVASC) 5 MG tablet Take 5 mg by mouth once.   Apoaequorin (PREVAGEN PO) Take 1 tablet by mouth daily.   Cyanocobalamin (VITAMIN B 12) 500 MCG TABS Take 500 mg by mouth once.   Fexofenadine HCl (ALLEGRA ALLERGY PO) Take 20 mg by mouth once.   Garlic (GARLIQUE PO) Take 1,000 mg by mouth once.   Ginkgo Biloba 40 MG TABS Take 60 mg by mouth 2 (two) times daily.   Glucosamine-Chondroit-Vit C-Mn (GLUCOSAMINE 1500 COMPLEX PO) Take 2,000 mg by mouth once.   hydrochlorothiazide (HYDRODIURIL) 25 MG tablet Take 25 mg by mouth every morning.   metoprolol tartrate (LOPRESSOR) 25 MG tablet Take 25 mg by mouth 2 (two) times daily.   Multiple Vitamins-Minerals (HAIR SKIN AND NAILS FORMULA PO) Take 2 each by mouth daily.   omeprazole (PRILOSEC) 20 MG capsule Take 20 mg by mouth 2 (two) times a day.  spironolactone (ALDACTONE) 25 MG tablet Take 25 mg by mouth daily.   [DISCONTINUED] aspirin EC 81 MG tablet Take 81 mg by mouth daily. Swallow whole.   [DISCONTINUED] Calcium-Phosphorus-Vitamin D (CITRACAL +D3 PO) Take 650 mg/kg/day by mouth once.   [DISCONTINUED] ferrous sulfate 325 (65 FE) MG tablet Take 325 mg by mouth daily with breakfast.   [DISCONTINUED] vitamin E (VITAMIN E) 180 MG (400 UNITS) capsule Take 400 Units by mouth daily.    No facility-administered encounter medications on file as of 12/17/2019.   ALLERGIES: Allergies  Allergen Reactions   Penicillins Rash and Swelling    VACCINATION STATUS:  There is no immunization history on file for this patient.  HPI Lori Ellison is 69 y.o. female who is status post I-131 thyroid ablation on October 28, 2019.  She has no new complaints today.  Her previsit labs show treatment effect but not hypothyroid yet.     Her subsequent thyroid uptake and scan confirmed inhomogeneous tracer distribution in both thyroid lobes consistent with multinodular goiter.   She has  family history of   unidentified thyroid dysfunction in her daughter. -She denies any personal history of goiter nor family history of thyroid malignancy. -She denies dysphagia, shortness of breath, nor voice change.   Review of Systems Limited as above.  Objective:    BP 120/78    Pulse 64    Ht 5' 4.5" (1.638 m)    Wt 190 lb (86.2 kg)    BMI 32.11 kg/m   Wt Readings from Last 3 Encounters:  12/17/19 190 lb (86.2 kg)  10/10/19 192 lb (87.1 kg)  11/22/18 186 lb (84.4 kg)    Physical Exam   Physical Exam- Limited  Constitutional:  Body mass index is 32.11 kg/m. , not in acute distress, normal state of mind Eyes:  EOMI, no exophthalmos Neck: Supple Thyroid: No gross goiter Respiratory: Adequate breathing efforts Musculoskeletal: no gross deformities, strength intact in all four extremities, no gross restriction of joint movements Skin:  no rashes, no hyperemia Neurological: no tremor with outstretched hands   -Thyroid function test: February 25, 2018 TSH 0-0 2 5 June 24, 2018 TSH 0038, free T4 0.9, total T3 132  October 31, 2018 thyroid uptake and scan FINDINGS: Heterogeneous uptake within the thyroid gland. There are multiple warm nodules. No discrete cold nodules evident.  4 hour I-123 uptake = 16.2% (normal 5-20%). 24 hour I-123 uptake = 38.1% (normal 10-30%)  IMPRESSION: 1. Multinodular thyroid gland. 2. Moderately elevated 24 hour iodine uptake and depressed TSH. 3. Findings most with consistent toxic multinodular goiter.  Repeat thyroid uptake and scan on October 01, 2019 FINDINGS: Inhomogeneous tracer distribution in both thyroid lobes demonstrating multiple areas of increased and decreased tracer uptake consistent with a multinodular thyroid gland.  No dominant mass.  4 hour I-123 uptake = 14% (normal 5-20%)  24 hour I-123 uptake = 30% (normal 10-30%)  IMPRESSION: Multinodular thyroid gland.  Borderline elevated 24 hour radio iodine uptake of 30%,  slightly decreased in a 38% on the previous exam.  Recent Results (from the past 2160 hour(s))  TSH     Status: Abnormal   Collection Time: 12/10/19  1:12 PM  Result Value Ref Range   TSH <0.01 (L) 0.40 - 4.50 mIU/L  T4, free     Status: None   Collection Time: 12/10/19  1:12 PM  Result Value Ref Range   Free T4 1.4 0.8 - 1.8 ng/dL  T3, free     Status:  None   Collection Time: 12/10/19  1:12 PM  Result Value Ref Range   T3, Free 4.2 2.3 - 4.2 pg/mL    Assessment & Plan:   1.  Toxic multinodular goiter  -Prior to her I-131 thyroid ablation, she did have T3 toxicosis.  Her previsit labs show treatment effect, not hypothyroid yet.    -Patient is made aware of the high likelihood of post ablative hypothyroidism with subsequent need for lifelong thyroid hormone replacement. She will return in 6 weeks with repeat thyroid function tests.  - I advised her  to maintain close follow up with Lori Heading, MD for primary care needs.     - Time spent on this patient care encounter:  20 minutes of which 50% was spent in  counseling and the rest reviewing  her current and  previous labs / studies and medications  doses and developing a plan for long term care. Lori Ellison  participated in the discussions, expressed understanding, and voiced agreement with the above plans.  All questions were answered to her satisfaction. she is encouraged to contact clinic should she have any questions or concerns prior to her return visit.    Follow up plan: Return in about 6 weeks (around 01/28/2020) for F/U with Pre-visit Labs.   Marquis Lunch, MD [facility] [address] [place], [place] [address] Phone: [phone]  Fax: [phone]     12/17/2019, 12:54 PM  This note was partially dictated with voice recognition software. Similar sounding words can be transcribed inadequately or may not  be corrected upon review.

## 2020-01-21 ENCOUNTER — Other Ambulatory Visit: Payer: Self-pay | Admitting: "Endocrinology

## 2020-01-22 LAB — T4, FREE: Free T4: 1 ng/dL (ref 0.8–1.8)

## 2020-01-22 LAB — TSH: TSH: 1.7 m[IU]/L (ref 0.40–4.50)

## 2020-01-22 LAB — T3, FREE: T3, Free: 2.7 pg/mL (ref 2.3–4.2)

## 2020-01-29 ENCOUNTER — Encounter: Payer: Self-pay | Admitting: "Endocrinology

## 2020-01-29 ENCOUNTER — Ambulatory Visit (INDEPENDENT_AMBULATORY_CARE_PROVIDER_SITE_OTHER): Payer: Medicare HMO | Admitting: "Endocrinology

## 2020-01-29 ENCOUNTER — Other Ambulatory Visit: Payer: Self-pay

## 2020-01-29 VITALS — BP 130/86 | HR 94 | Ht 64.5 in | Wt 196.0 lb

## 2020-01-29 DIAGNOSIS — E0520 Thyrotoxicosis with toxic multinodular goiter without thyrotoxic crisis or storm: Secondary | ICD-10-CM

## 2020-01-29 NOTE — Progress Notes (Signed)
01/29/2020, 5:34 PM  Endocrinology follow-up note                   Subjective:    Patient ID: Lori Ellison, female    DOB: 05/23/1950, PCP Arma Heading, MD   Past Medical History:  Diagnosis Date  . Hypertension   . Moderate persistent asthma   . Spinal stenosis at L4-L5 level    Past Surgical History:  Procedure Laterality Date  . BUNIONECTOMY    . FOOT ARTHRODESIS, MODIFIED MCBRIDE Bilateral 2010   Social History   Socioeconomic History  . Marital status: Single    Spouse name: Not on file  . Number of children: Not on file  . Years of education: Not on file  . Highest education level: Not on file  Occupational History  . Not on file  Tobacco Use  . Smoking status: Former Smoker    Types: Cigarettes    Quit date: 1980    Years since quitting: 41.7  . Smokeless tobacco: Never Used  Vaping Use  . Vaping Use: Never used  Substance and Sexual Activity  . Alcohol use: Never  . Drug use: Never  . Sexual activity: Not on file  Other Topics Concern  . Not on file  Social History Narrative  . Not on file   Social Determinants of Health   Financial Resource Strain:   . Difficulty of Paying Living Expenses: Not on file  Food Insecurity:   . Worried About Programme researcher, broadcasting/film/video in the Last Year: Not on file  . Ran Out of Food in the Last Year: Not on file  Transportation Needs:   . Lack of Transportation (Medical): Not on file  . Lack of Transportation (Non-Medical): Not on file  Physical Activity:   . Days of Exercise per Week: Not on file  . Minutes of Exercise per Session: Not on file  Stress:   . Feeling of Stress : Not on file  Social Connections:   . Frequency of Communication with Friends and Family: Not on file  . Frequency of Social Gatherings with Friends and Family: Not on file  . Attends Religious Services: Not on file  . Active Member of Clubs or Organizations: Not on file  . Attends Occupational hygienist Meetings: Not on file  . Marital Status: Not on file   History reviewed. No pertinent family history. Outpatient Encounter Medications as of 01/29/2020  Medication Sig  . amLODipine (NORVASC) 5 MG tablet Take 5 mg by mouth once.  Marland Kitchen Apoaequorin (PREVAGEN PO) Take 1 tablet by mouth daily.  . carvedilol (COREG) 6.25 MG tablet Take 6.25 mg by mouth 2 (two) times daily.  . chlorthalidone (HYGROTON) 25 MG tablet Take 25 mg by mouth daily.  . Cyanocobalamin (VITAMIN B 12) 500 MCG TABS Take 500 mg by mouth once.  Marland Kitchen Fexofenadine HCl (ALLEGRA ALLERGY PO) Take 20 mg by mouth once.  . Garlic (GARLIQUE PO) Take 1,000 mg by mouth once.  . Ginkgo Biloba 40 MG TABS Take 60 mg by mouth 2 (two) times daily.  . Glucosamine-Chondroit-Vit C-Mn (GLUCOSAMINE 1500 COMPLEX PO) Take 2,000 mg by mouth once.  . Multiple Vitamins-Minerals (HAIR SKIN AND NAILS FORMULA  PO) Take 2 each by mouth daily.  Marland Kitchen omeprazole (PRILOSEC) 20 MG capsule Take 20 mg by mouth 2 (two) times a day.  . potassium chloride (KLOR-CON) 10 MEQ tablet Take 10 mEq by mouth daily.  . [DISCONTINUED] hydrochlorothiazide (HYDRODIURIL) 25 MG tablet Take 25 mg by mouth every morning.  . [DISCONTINUED] metoprolol tartrate (LOPRESSOR) 25 MG tablet Take 25 mg by mouth 2 (two) times daily.  . [DISCONTINUED] spironolactone (ALDACTONE) 25 MG tablet Take 25 mg by mouth daily.   No facility-administered encounter medications on file as of 01/29/2020.   ALLERGIES: Allergies  Allergen Reactions  . Penicillins Rash and Swelling    VACCINATION STATUS:  There is no immunization history on file for this patient.  HPI Lori Ellison is 69 y.o. female who is status post I-131 thyroid ablation on October 28, 2019.  She is returning with repeat thyroid function tests for follow-up.  She has no new complaints today.  Her previsit labs show treatment effect but not hypothyroid yet.   Prior to her therapeutic I-131, thyroid uptake and scan confirmed in  homogeneous tracer distribution in both thyroid lobes consistent with multinodular goiter.   She has  family history of  unidentified thyroid dysfunction in her daughter. -She denies any personal history of goiter nor family history of thyroid malignancy. -She denies dysphagia, shortness of breath, nor voice change.   Review of Systems Limited as above.  Objective:    BP 130/86   Pulse 94   Ht 5' 4.5" (1.638 m)   Wt 196 lb (88.9 kg)   BMI 33.12 kg/m   Wt Readings from Last 3 Encounters:  01/29/20 196 lb (88.9 kg)  12/17/19 190 lb (86.2 kg)  10/10/19 192 lb (87.1 kg)     Physical Exam- Limited  Constitutional:  Body mass index is 33.12 kg/m. , not in acute distress, normal state of mind Eyes:  EOMI, no exophthalmos Neck: Supple Thyroid: No gross goiter Respiratory: Adequate breathing efforts Musculoskeletal: no gross deformities, strength intact in all four extremities, no gross restriction of joint movements Skin:  no rashes, no hyperemia Neurological: no tremor with outstretched hands    -Thyroid function test: February 25, 2018 TSH 0-0 2 5 June 24, 2018 TSH 0038, free T4 0.9, total T3 132  October 31, 2018 thyroid uptake and scan FINDINGS: Heterogeneous uptake within the thyroid gland. There are multiple warm nodules. No discrete cold nodules evident.  4 hour I-123 uptake = 16.2% (normal 5-20%). 24 hour I-123 uptake = 38.1% (normal 10-30%)  IMPRESSION: 1. Multinodular thyroid gland. 2. Moderately elevated 24 hour iodine uptake and depressed TSH. 3. Findings most with consistent toxic multinodular goiter.  Repeat thyroid uptake and scan on October 01, 2019 FINDINGS: Inhomogeneous tracer distribution in both thyroid lobes demonstrating multiple areas of increased and decreased tracer uptake consistent with a multinodular thyroid gland.  No dominant mass.  4 hour I-123 uptake = 14% (normal 5-20%)  24 hour I-123 uptake = 30% (normal  10-30%)  IMPRESSION: Multinodular thyroid gland.  Borderline elevated 24 hour radio iodine uptake of 30%, slightly decreased in a 38% on the previous exam.  Recent Results (from the past 2160 hour(s))  TSH     Status: Abnormal   Collection Time: 12/10/19  1:12 PM  Result Value Ref Range   TSH <0.01 (L) 0.40 - 4.50 mIU/L  T4, free     Status: None   Collection Time: 12/10/19  1:12 PM  Result Value Ref Range   Free  T4 1.4 0.8 - 1.8 ng/dL  T3, free     Status: None   Collection Time: 12/10/19  1:12 PM  Result Value Ref Range   T3, Free 4.2 2.3 - 4.2 pg/mL  TSH     Status: None   Collection Time: 01/21/20  3:24 PM  Result Value Ref Range   TSH 1.70 0.40 - 4.50 mIU/L  T4, free     Status: None   Collection Time: 01/21/20  3:24 PM  Result Value Ref Range   Free T4 1.0 0.8 - 1.8 ng/dL  T3, free     Status: None   Collection Time: 01/21/20  3:24 PM  Result Value Ref Range   T3, Free 2.7 2.3 - 4.2 pg/mL    Assessment & Plan:   1.  Toxic multinodular goiter  -Prior to her I-131 thyroid ablation, she did have T3 toxicosis.  Her previsit thyroid function tests show treatment effect, not hypothyroid yet.  We will not be initiated on thyroid hormone replacement at this point.   -Patient is made aware of the high likelihood of post ablative hypothyroidism with subsequent need for lifelong thyroid hormone replacement.  She will return in 3 months with repeat thyroid function tests.    - I advised her  to maintain close follow up with Arma Heading, MD for primary care needs.     - Time spent on this patient care encounter:  20 minutes of which 50% was spent in  counseling and the rest reviewing  her current and  previous labs / studies and medications  doses and developing a plan for long term care. Lori Ellison  participated in the discussions, expressed understanding, and voiced agreement with the above plans.  All questions were answered to her satisfaction. she is  encouraged to contact clinic should she have any questions or concerns prior to her return visit.    Follow up plan: Return in about 4 months (around 05/30/2020) for F/U with Pre-visit Labs.   Marquis Lunch, MD [facility] [address] [place], [place] [address] Phone: [phone]  Fax: [phone]     01/29/2020, 5:34 PM  This note was partially dictated with voice recognition software. Similar sounding words can be transcribed inadequately or may not  be corrected upon review.

## 2020-03-17 IMAGING — NM NUCLEAR MEDICINE THYROID UPTAKE (4 AND 24 HOUR) AND SCAN
4 series · 4 of 4 positions shown · non-contrast
Comparison: None.

CLINICAL DATA: Hyperthyroidism. Heart palpitations. Dry hair.
Difficulty sleeping. Anxious. Difficulty swallowing. TSH equal
(10/18/2018)

EXAM:
THYROID SCAN AND UPTAKE - 4 AND 24 HOURS
TECHNIQUE: Following oral administration of J-GLJ capsule, anterior planar
imaging was acquired at 24 hours. Thyroid uptake was calculated with
a thyroid probe at 4-6 hours and 24 hours.
RADIOPHARMACEUTICALS:  Four hundred fifty-one uCi J-GLJ sodium
iodide p.o.

[Series 1: ant w marker · 1.18mm/px · 1 of 1 slices shown]
[im 1/1  full-range]
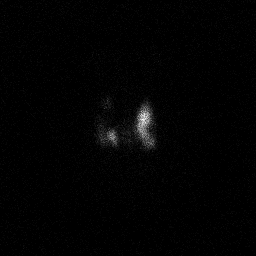

[Series 2: anterior · 1.18mm/px · 1 of 1 slices shown]
[im 1/1  full-range]
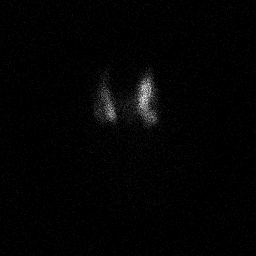

[Series 3: lao · 1.18mm/px · 1 of 1 slices shown]
[im 1/1  full-range]
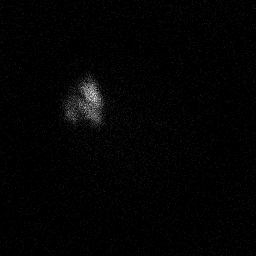

[Series 4: rao · 1.18mm/px · 1 of 1 slices shown]
[im 1/1  full-range]
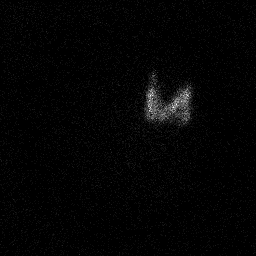

[4 of 4 positions shown; findings below may reference images not displayed]

FINDINGS: Heterogeneous uptake within the thyroid gland. There are multiple
warm nodules. No discrete cold nodules evident.

4 hour J-GLJ uptake = 16.2% (normal 5-20%)

24 hour J-GLJ uptake = 38.1% (normal 10-30%)
IMPRESSION: 1. Multinodular thyroid gland.
2. Moderately elevated 24 hour iodine uptake and depressed TSH.
3. Findings most with consistent toxic multinodular goiter.

## 2020-05-26 LAB — T4, FREE: Free T4: 1.32 ng/dL (ref 0.82–1.77)

## 2020-05-26 LAB — TSH: TSH: 1.69 u[IU]/mL (ref 0.450–4.500)

## 2020-05-26 LAB — T3, FREE: T3, Free: 3.1 pg/mL (ref 2.0–4.4)

## 2020-05-31 ENCOUNTER — Ambulatory Visit: Payer: Medicare HMO | Admitting: "Endocrinology

## 2020-05-31 ENCOUNTER — Other Ambulatory Visit: Payer: Self-pay

## 2020-05-31 ENCOUNTER — Ambulatory Visit (INDEPENDENT_AMBULATORY_CARE_PROVIDER_SITE_OTHER): Payer: Medicare HMO | Admitting: "Endocrinology

## 2020-05-31 ENCOUNTER — Encounter: Payer: Self-pay | Admitting: "Endocrinology

## 2020-05-31 VITALS — BP 106/64 | HR 72 | Ht 64.5 in | Wt 190.6 lb

## 2020-05-31 DIAGNOSIS — E0520 Thyrotoxicosis with toxic multinodular goiter without thyrotoxic crisis or storm: Secondary | ICD-10-CM | POA: Diagnosis not present

## 2020-05-31 NOTE — Progress Notes (Signed)
05/31/2020, 11:38 AM  Endocrinology follow-up note                   Subjective:    Patient ID: Lori Ellison, female    DOB: 1950/05/22, PCP Arma Heading, MD   Past Medical History:  Diagnosis Date  . Hypertension   . Moderate persistent asthma   . Spinal stenosis at L4-L5 level    Past Surgical History:  Procedure Laterality Date  . BUNIONECTOMY    . FOOT ARTHRODESIS, MODIFIED MCBRIDE Bilateral 2010   Social History   Socioeconomic History  . Marital status: Single    Spouse name: Not on file  . Number of children: Not on file  . Years of education: Not on file  . Highest education level: Not on file  Occupational History  . Not on file  Tobacco Use  . Smoking status: Former Smoker    Types: Cigarettes    Quit date: 1980    Years since quitting: 42.1  . Smokeless tobacco: Never Used  Vaping Use  . Vaping Use: Never used  Substance and Sexual Activity  . Alcohol use: Never  . Drug use: Never  . Sexual activity: Not on file  Other Topics Concern  . Not on file  Social History Narrative  . Not on file   Social Determinants of Health   Financial Resource Strain: Not on file  Food Insecurity: Not on file  Transportation Needs: Not on file  Physical Activity: Not on file  Stress: Not on file  Social Connections: Not on file   History reviewed. No pertinent family history. Outpatient Encounter Medications as of 05/31/2020  Medication Sig  . amLODipine (NORVASC) 5 MG tablet Take 5 mg by mouth once.  Marland Kitchen Apoaequorin (PREVAGEN PO) Take 1 tablet by mouth daily.  . carvedilol (COREG) 6.25 MG tablet Take 6.25 mg by mouth 2 (two) times daily.  . chlorthalidone (HYGROTON) 25 MG tablet Take 25 mg by mouth daily.  . Cyanocobalamin (VITAMIN B 12) 500 MCG TABS Take 500 mg by mouth once.  Marland Kitchen Fexofenadine HCl (ALLEGRA ALLERGY PO) Take 20 mg by mouth once.  . Garlic (GARLIQUE PO) Take 1,000 mg by mouth once.  .  Ginkgo Biloba 40 MG TABS Take 60 mg by mouth 2 (two) times daily.  . Glucosamine-Chondroit-Vit C-Mn (GLUCOSAMINE 1500 COMPLEX PO) Take 2,000 mg by mouth once.  . isosorbide mononitrate (IMDUR) 30 MG 24 hr tablet Take 30 mg by mouth daily.  . Multiple Vitamins-Minerals (HAIR SKIN AND NAILS FORMULA PO) Take 2 each by mouth daily.  Marland Kitchen omeprazole (PRILOSEC) 20 MG capsule Take 20 mg by mouth 2 (two) times a day.  . potassium chloride (KLOR-CON) 10 MEQ tablet Take 20 mEq by mouth daily.  . rosuvastatin (CRESTOR) 20 MG tablet Take 20 mg by mouth at bedtime.   No facility-administered encounter medications on file as of 05/31/2020.   ALLERGIES: Allergies  Allergen Reactions  . Penicillins Rash and Swelling    VACCINATION STATUS:  There is no immunization history on file for this patient.  HPI Lori Ellison is 70 y.o. female who is status post I-131 thyroid ablation on October 28, 2019.  She is returning with repeat thyroid  function test for follow-up.   She has no new complaints today.  Her previsit labs show treatment effect with thyroid function tests within normal limits.  She is not hypothyroid.      Prior to her therapeutic I-131, thyroid uptake and scan confirmed inhomogeneous tracer distribution in both thyroid lobes consistent with multinodular goiter.   She has  family history of  unidentified thyroid dysfunction in her daughter. -She denies any personal history of goiter nor family history of thyroid malignancy. -She denies dysphagia, shortness of breath, nor voice change.   Review of Systems Limited as above.  Objective:    BP 106/64   Pulse 72   Ht 5' 4.5" (1.638 m)   Wt 190 lb 9.6 oz (86.5 kg)   BMI 32.21 kg/m   Wt Readings from Last 3 Encounters:  05/31/20 190 lb 9.6 oz (86.5 kg)  01/29/20 196 lb (88.9 kg)  12/17/19 190 lb (86.2 kg)     Physical Exam- Limited     -Thyroid function test: February 25, 2018 TSH 0-0 2 5 June 24, 2018 TSH 0038, free T4 0.9, total  T3 132  October 31, 2018 thyroid uptake and scan FINDINGS: Heterogeneous uptake within the thyroid gland. There are multiple warm nodules. No discrete cold nodules evident.  4 hour I-123 uptake = 16.2% (normal 5-20%). 24 hour I-123 uptake = 38.1% (normal 10-30%)  IMPRESSION: 1. Multinodular thyroid gland. 2. Moderately elevated 24 hour iodine uptake and depressed TSH. 3. Findings most with consistent toxic multinodular goiter.  Repeat thyroid uptake and scan on October 01, 2019 FINDINGS: Inhomogeneous tracer distribution in both thyroid lobes demonstrating multiple areas of increased and decreased tracer uptake consistent with a multinodular thyroid gland.  No dominant mass.  4 hour I-123 uptake = 14% (normal 5-20%)  24 hour I-123 uptake = 30% (normal 10-30%)  IMPRESSION: Multinodular thyroid gland.  Borderline elevated 24 hour radio iodine uptake of 30%, slightly decreased in a 38% on the previous exam.  Recent Results (from the past 2160 hour(s))  TSH     Status: None   Collection Time: 05/25/20 10:50 AM  Result Value Ref Range   TSH 1.690 0.450 - 4.500 uIU/mL  T4, free     Status: None   Collection Time: 05/25/20 10:50 AM  Result Value Ref Range   Free T4 1.32 0.82 - 1.77 ng/dL  T3, free     Status: None   Collection Time: 05/25/20 10:50 AM  Result Value Ref Range   T3, Free 3.1 2.0 - 4.4 pg/mL    Assessment & Plan:   1.  Toxic multinodular goiter  -Prior to her I-131 thyroid ablation, she did have T3 toxicosis.  Her previsit thyroid function tests treatment effect showing euthyroid state.  She did not develop hypothyroidism.  She will not need initiation of thyroid hormone at this time.    -Patient is made aware of the high likelihood of post ablative hypothyroidism with subsequent need for lifelong thyroid hormone replacement.  She will return in 6 months with repeat thyroid function test for follow-up.  She is advised to call sooner if she develops cold  intolerance, unexplained weight gain.     - I advised her  to maintain close follow up with Arma Heading, MD for primary care needs.     - Time spent on this patient care encounter:  20 minutes of which 50% was spent in  counseling and the rest reviewing  her current and  previous labs /  studies and medications  doses and developing a plan for long term care. Lori Ellison  participated in the discussions, expressed understanding, and voiced agreement with the above plans.  All questions were answered to her satisfaction. she is encouraged to contact clinic should she have any questions or concerns prior to her return visit.    Follow up plan: Return in about 6 months (around 11/28/2020) for F/U with Pre-visit Labs.   Marquis Lunch, MD [facility] [address] [place], [place] [address] Phone: [phone]  Fax: [phone]     05/31/2020, 11:38 AM  This note was partially dictated with voice recognition software. Similar sounding words can be transcribed inadequately or may not  be corrected upon review.

## 2020-11-24 LAB — T3, FREE: T3, Free: 3.1 pg/mL (ref 2.0–4.4)

## 2020-11-24 LAB — TSH: TSH: 1.58 u[IU]/mL (ref 0.450–4.500)

## 2020-11-24 LAB — T4, FREE: Free T4: 1.13 ng/dL (ref 0.82–1.77)

## 2020-11-29 ENCOUNTER — Ambulatory Visit: Payer: Medicare HMO | Admitting: "Endocrinology

## 2020-11-30 ENCOUNTER — Ambulatory Visit: Payer: Medicare HMO | Admitting: "Endocrinology

## 2020-12-02 ENCOUNTER — Ambulatory Visit (INDEPENDENT_AMBULATORY_CARE_PROVIDER_SITE_OTHER): Payer: Medicare HMO | Admitting: "Endocrinology

## 2020-12-02 ENCOUNTER — Encounter: Payer: Self-pay | Admitting: "Endocrinology

## 2020-12-02 VITALS — BP 120/70 | HR 56 | Ht 64.5 in | Wt 187.6 lb

## 2020-12-02 DIAGNOSIS — E0520 Thyrotoxicosis with toxic multinodular goiter without thyrotoxic crisis or storm: Secondary | ICD-10-CM

## 2020-12-02 NOTE — Progress Notes (Signed)
12/02/2020, 4:33 PM  Endocrinology follow-up note                   Subjective:    Patient ID: Lori Ellison, female    DOB: June 26, 1950, PCP Arma Heading, MD   Past Medical History:  Diagnosis Date   Hypertension    Moderate persistent asthma    Spinal stenosis at L4-L5 level    Past Surgical History:  Procedure Laterality Date   BUNIONECTOMY     FOOT ARTHRODESIS, MODIFIED MCBRIDE Bilateral 2010   Social History   Socioeconomic History   Marital status: Single    Spouse name: Not on file   Number of children: Not on file   Years of education: Not on file   Highest education level: Not on file  Occupational History   Not on file  Tobacco Use   Smoking status: Former    Types: Cigarettes    Quit date: 1980    Years since quitting: 42.6   Smokeless tobacco: Never  Vaping Use   Vaping Use: Never used  Substance and Sexual Activity   Alcohol use: Never   Drug use: Never   Sexual activity: Not on file  Other Topics Concern   Not on file  Social History Narrative   Not on file   Social Determinants of Health   Financial Resource Strain: Not on file  Food Insecurity: Not on file  Transportation Needs: Not on file  Physical Activity: Not on file  Stress: Not on file  Social Connections: Not on file   History reviewed. No pertinent family history. Outpatient Encounter Medications as of 12/02/2020  Medication Sig   aspirin EC 81 MG tablet Take 81 mg by mouth daily. Swallow whole.   Cholecalciferol (VITAMIN D3) 25 MCG (1000 UT) CAPS Take 1 capsule by mouth daily in the afternoon.   amLODipine (NORVASC) 5 MG tablet Take 5 mg by mouth once.   Apoaequorin (PREVAGEN PO) Take 1 tablet by mouth daily.   carvedilol (COREG) 6.25 MG tablet Take 6.25 mg by mouth 2 (two) times daily.   chlorthalidone (HYGROTON) 25 MG tablet Take 25 mg by mouth daily.   Cyanocobalamin (VITAMIN B 12) 500 MCG TABS Take 500 mg by mouth  once.   Fexofenadine HCl (ALLEGRA ALLERGY PO) Take 20 mg by mouth once.   Garlic (GARLIQUE PO) Take 1,000 mg by mouth once. (Patient not taking: Reported on 12/02/2020)   Ginkgo Biloba 40 MG TABS Take 60 mg by mouth daily in the afternoon.   Glucosamine-Chondroit-Vit C-Mn (GLUCOSAMINE 1500 COMPLEX PO) Take 2,000 mg by mouth once.   isosorbide mononitrate (IMDUR) 30 MG 24 hr tablet Take 30 mg by mouth daily. (Patient not taking: Reported on 12/02/2020)   Multiple Vitamins-Minerals (HAIR SKIN AND NAILS FORMULA PO) Take 2 each by mouth daily.   omeprazole (PRILOSEC) 20 MG capsule Take 20 mg by mouth 2 (two) times a day.   potassium chloride (KLOR-CON) 10 MEQ tablet Take 20 mEq by mouth daily.   rosuvastatin (CRESTOR) 20 MG tablet Take 20 mg by mouth at bedtime.   No facility-administered encounter medications on file as of 12/02/2020.   ALLERGIES: Allergies  Allergen Reactions   Penicillins Rash  and Swelling    VACCINATION STATUS:  There is no immunization history on file for this patient.  HPI Lori Ellison is 70 y.o. female who is status post I-131 thyroid ablation on October 28, 2019.  She is returning with repeat t thyroid function tests for follow-up accompanied by her 2 granddaughters.   She has no new complaints today.  Her previsit labs show treatment effect with thyroid function tests within normal limits.  She is not hypothyroid.      Prior to her therapeutic I-131, thyroid uptake and scan confirmed inhomogeneous tracer distribution in both thyroid lobes consistent with multinodular goiter.   She has  family history of  unidentified thyroid dysfunction in her daughter. -She denies any personal history of goiter nor family history of thyroid malignancy. -She denies dysphagia, shortness of breath, nor voice change.   Review of Systems Limited as above.  Objective:    BP 120/70   Pulse (!) 56   Ht 5' 4.5" (1.638 m)   Wt 187 lb 9.6 oz (85.1 kg)   BMI 31.70 kg/m   Wt Readings  from Last 3 Encounters:  12/02/20 187 lb 9.6 oz (85.1 kg)  05/31/20 190 lb 9.6 oz (86.5 kg)  01/29/20 196 lb (88.9 kg)     Physical Exam- Limited     -Thyroid function test: February 25, 2018 TSH 0-0 2 5 June 24, 2018 TSH 0038, free T4 0.9, total T3 132  October 31, 2018 thyroid uptake and scan FINDINGS: Heterogeneous uptake within the thyroid gland. There are multiple warm nodules. No discrete cold nodules evident.   4 hour I-123 uptake = 16.2% (normal 5-20%).  24 hour I-123 uptake = 38.1% (normal 10-30%)   IMPRESSION: 1. Multinodular thyroid gland. 2. Moderately elevated 24 hour iodine uptake and depressed TSH. 3. Findings most with consistent toxic multinodular goiter.  Repeat thyroid uptake and scan on October 01, 2019 FINDINGS: Inhomogeneous tracer distribution in both thyroid lobes demonstrating multiple areas of increased and decreased tracer uptake consistent with a multinodular thyroid gland.   No dominant mass.   4 hour I-123 uptake = 14% (normal 5-20%)   24 hour I-123 uptake = 30% (normal 10-30%)   IMPRESSION: Multinodular thyroid gland.   Borderline elevated 24 hour radio iodine uptake of 30%, slightly decreased in a 38% on the previous exam.  Recent Results (from the past 2160 hour(s))  TSH     Status: None   Collection Time: 11/23/20 11:09 AM  Result Value Ref Range   TSH 1.580 0.450 - 4.500 uIU/mL  T4, free     Status: None   Collection Time: 11/23/20 11:09 AM  Result Value Ref Range   Free T4 1.13 0.82 - 1.77 ng/dL  T3, free     Status: None   Collection Time: 11/23/20 11:09 AM  Result Value Ref Range   T3, Free 3.1 2.0 - 4.4 pg/mL    Assessment & Plan:   1.  Toxic multinodular goiter  -Prior to her I-131 thyroid ablation, she did have T3 toxicosis.  She was previously treated with methimazole briefly.  Her previsit thyroid function tests are consistent with euthyroid state.    She will not need initiation of thyroid hormone at this time.    Regarding her nodular goiter, she will need repeat thyroid/neck ultrasound before her next visit.   - I advised her  to maintain close follow up with Arma Heading, MD for primary care needs.    I spent 22 minutes in  the care of the patient today including review of labs from Thyroid Function, CMP, and other relevant labs ; imaging/biopsy records (current and previous including abstractions from other facilities); face-to-face time discussing  her lab results and symptoms, medications doses, her options of short and long term treatment based on the latest standards of care / guidelines;   and documenting the encounter.  Lori Ellison  participated in the discussions, expressed understanding, and voiced agreement with the above plans.  All questions were answered to her satisfaction. she is encouraged to contact clinic should she have any questions or concerns prior to her return visit.    Follow up plan: Return in about 6 months (around 06/04/2021) for F/U with Pre-visit Labs.   Marquis Lunch, MD [facility] [address] [place], [place] [address] Phone: [phone]  Fax: [phone]     12/02/2020, 4:33 PM  This note was partially dictated with voice recognition software. Similar sounding words can be transcribed inadequately or may not  be corrected upon review.

## 2021-06-03 LAB — TSH: TSH: 1.44 u[IU]/mL (ref 0.450–4.500)

## 2021-06-03 LAB — T3, FREE: T3, Free: 3.1 pg/mL (ref 2.0–4.4)

## 2021-06-03 LAB — T4, FREE: Free T4: 1.16 ng/dL (ref 0.82–1.77)

## 2021-06-06 ENCOUNTER — Other Ambulatory Visit: Payer: Self-pay

## 2021-06-06 ENCOUNTER — Encounter: Payer: Self-pay | Admitting: "Endocrinology

## 2021-06-06 ENCOUNTER — Ambulatory Visit: Payer: Medicare HMO | Admitting: "Endocrinology

## 2021-06-06 VITALS — BP 112/78 | HR 60 | Ht 64.5 in | Wt 183.6 lb

## 2021-06-06 DIAGNOSIS — E0520 Thyrotoxicosis with toxic multinodular goiter without thyrotoxic crisis or storm: Secondary | ICD-10-CM | POA: Diagnosis not present

## 2021-06-06 NOTE — Progress Notes (Signed)
06/06/2021, 2:13 PM  Endocrinology follow-up note                   Subjective:    Patient ID: Lori Ellison, female    DOB: May 01, 1951, PCP Arma Heading, MD   Past Medical History:  Diagnosis Date   Hypertension    Moderate persistent asthma    Spinal stenosis at L4-L5 level    Past Surgical History:  Procedure Laterality Date   BUNIONECTOMY     FOOT ARTHRODESIS, MODIFIED MCBRIDE Bilateral 2010   Social History   Socioeconomic History   Marital status: Single    Spouse name: Not on file   Number of children: Not on file   Years of education: Not on file   Highest education level: Not on file  Occupational History   Not on file  Tobacco Use   Smoking status: Former    Types: Cigarettes    Quit date: 1980    Years since quitting: 43.1   Smokeless tobacco: Never  Vaping Use   Vaping Use: Never used  Substance and Sexual Activity   Alcohol use: Never   Drug use: Never   Sexual activity: Not on file  Other Topics Concern   Not on file  Social History Narrative   Not on file   Social Determinants of Health   Financial Resource Strain: Not on file  Food Insecurity: Not on file  Transportation Needs: Not on file  Physical Activity: Not on file  Stress: Not on file  Social Connections: Not on file   History reviewed. No pertinent family history. Outpatient Encounter Medications as of 06/06/2021  Medication Sig   amLODipine (NORVASC) 5 MG tablet Take 5 mg by mouth once.   Apoaequorin (PREVAGEN PO) Take 1 tablet by mouth daily.   aspirin EC 81 MG tablet Take 81 mg by mouth daily. Swallow whole.   carvedilol (COREG) 6.25 MG tablet Take 6.25 mg by mouth 2 (two) times daily.   chlorthalidone (HYGROTON) 25 MG tablet Take 25 mg by mouth daily.   Cholecalciferol (VITAMIN D3) 25 MCG (1000 UT) CAPS Take 1 capsule by mouth daily in the afternoon.   Cyanocobalamin (VITAMIN B 12) 500 MCG TABS Take 500 mg by mouth  once.   Fexofenadine HCl (ALLEGRA ALLERGY PO) Take 20 mg by mouth once.   Garlic (GARLIQUE PO) Take 1,000 mg by mouth once. (Patient not taking: Reported on 12/02/2020)   Ginkgo Biloba 40 MG TABS Take 60 mg by mouth daily in the afternoon.   Glucosamine-Chondroit-Vit C-Mn (GLUCOSAMINE 1500 COMPLEX PO) Take 2,000 mg by mouth once.   isosorbide mononitrate (IMDUR) 30 MG 24 hr tablet Take 30 mg by mouth daily. (Patient not taking: Reported on 12/02/2020)   Multiple Vitamins-Minerals (HAIR SKIN AND NAILS FORMULA PO) Take 2 each by mouth daily.   omeprazole (PRILOSEC) 20 MG capsule Take 20 mg by mouth 2 (two) times a day.   potassium chloride (KLOR-CON) 10 MEQ tablet Take 20 mEq by mouth daily.   rosuvastatin (CRESTOR) 20 MG tablet Take 20 mg by mouth at bedtime.   No facility-administered encounter medications on file as of 06/06/2021.   ALLERGIES: Allergies  Allergen Reactions   Penicillins Rash  and Swelling    VACCINATION STATUS:  There is no immunization history on file for this patient.  HPI Lori Ellison is 71 y.o. female who is status post I-131 thyroid ablation on October 28, 2019.  She is returning with repeat t thyroid function tests for follow-up .   She has no new complaints today.  Her previsit labs show treatment effect with thyroid hormone levels within normal limits.  She is not hypothyroid, was not initiated on thyroid hormone replacement.     Prior to her therapeutic I-131, thyroid uptake and scan confirmed inhomogeneous tracer distribution in both thyroid lobes consistent with multinodular goiter.   She has  family history of  unidentified thyroid dysfunction in her daughter. -She denies any personal history of goiter nor family history of thyroid malignancy. -She denies dysphagia, shortness of breath, nor voice change.   Review of Systems Limited as above.  Objective:    BP 112/78    Pulse 60    Ht 5' 4.5" (1.638 m)    Wt 183 lb 9.6 oz (83.3 kg)    BMI 31.03 kg/m   Wt  Readings from Last 3 Encounters:  06/06/21 183 lb 9.6 oz (83.3 kg)  12/02/20 187 lb 9.6 oz (85.1 kg)  05/31/20 190 lb 9.6 oz (86.5 kg)     Physical Exam- Limited     -Thyroid function test: February 25, 2018 TSH 0-0 2 5 June 24, 2018 TSH 0038, free T4 0.9, total T3 132  October 31, 2018 thyroid uptake and scan FINDINGS: Heterogeneous uptake within the thyroid gland. There are multiple warm nodules. No discrete cold nodules evident.   4 hour I-123 uptake = 16.2% (normal 5-20%).  24 hour I-123 uptake = 38.1% (normal 10-30%)   IMPRESSION: 1. Multinodular thyroid gland. 2. Moderately elevated 24 hour iodine uptake and depressed TSH. 3. Findings most with consistent toxic multinodular goiter.  Repeat thyroid uptake and scan on October 01, 2019 FINDINGS: Inhomogeneous tracer distribution in both thyroid lobes demonstrating multiple areas of increased and decreased tracer uptake consistent with a multinodular thyroid gland.   No dominant mass.   4 hour I-123 uptake = 14% (normal 5-20%)   24 hour I-123 uptake = 30% (normal 10-30%)   IMPRESSION: Multinodular thyroid gland.   Borderline elevated 24 hour radio iodine uptake of 30%, slightly decreased in a 38% on the previous exam.  Recent Results (from the past 2160 hour(s))  TSH     Status: None   Collection Time: 06/02/21 10:23 AM  Result Value Ref Range   TSH 1.440 0.450 - 4.500 uIU/mL  T4, free     Status: None   Collection Time: 06/02/21 10:23 AM  Result Value Ref Range   Free T4 1.16 0.82 - 1.77 ng/dL  T3, free     Status: None   Collection Time: 06/02/21 10:23 AM  Result Value Ref Range   T3, Free 3.1 2.0 - 4.4 pg/mL    Assessment & Plan:   1.  Toxic multinodular goiter  -Prior to her I-131 thyroid ablation, she did have T3 toxicosis.  She was previously treated with methimazole briefly.  Her previsit thyroid function tests are consistent with euthyroid state.  She will not need thyroid hormone supplement.  She  will need repeat thyroid function test and office visit in 1 year.    - I advised her  to maintain close follow up with Arma Heading, MD for primary care needs.   I spent 20 minutes in the  care of the patient today including review of labs from Thyroid Function, CMP, and other relevant labs ; imaging/biopsy records (current and previous including abstractions from other facilities); face-to-face time discussing  her lab results and symptoms, medications doses, her options of short and long term treatment based on the latest standards of care / guidelines;   and documenting the encounter.  Lori Ellison  participated in the discussions, expressed understanding, and voiced agreement with the above plans.  All questions were answered to her satisfaction. she is encouraged to contact clinic should she have any questions or concerns prior to her return visit.    Follow up plan: Return in about 1 year (around 06/06/2022) for F/U with Pre-visit Labs.   Marquis Lunch, MD [facility] [address] [place], [place] [address] Phone: [phone]  Fax: [phone]     06/06/2021, 2:13 PM  This note was partially dictated with voice recognition software. Similar sounding words can be transcribed inadequately or may not  be corrected upon review.

## 2022-06-01 LAB — T4, FREE: Free T4: 1.11 ng/dL (ref 0.82–1.77)

## 2022-06-01 LAB — TSH: TSH: 1.22 u[IU]/mL (ref 0.450–4.500)

## 2022-06-01 LAB — T3, FREE: T3, Free: 2.6 pg/mL (ref 2.0–4.4)

## 2022-06-06 ENCOUNTER — Ambulatory Visit: Payer: Medicare HMO | Admitting: "Endocrinology

## 2022-06-06 ENCOUNTER — Encounter: Payer: Self-pay | Admitting: "Endocrinology

## 2022-06-06 VITALS — BP 132/68 | HR 56 | Ht 66.0 in | Wt 187.4 lb

## 2022-06-06 DIAGNOSIS — E0520 Thyrotoxicosis with toxic multinodular goiter without thyrotoxic crisis or storm: Secondary | ICD-10-CM

## 2022-06-06 NOTE — Progress Notes (Signed)
06/06/2022, 12:25 PM  Endocrinology follow-up note                   Subjective:    Patient ID: Lori Ellison, female    DOB: 06/09/1950, PCP Lonia Mad, MD   Past Medical History:  Diagnosis Date   Hypertension    Moderate persistent asthma    Spinal stenosis at L4-L5 level    Past Surgical History:  Procedure Laterality Date   BUNIONECTOMY     FOOT ARTHRODESIS, MODIFIED MCBRIDE Bilateral 2010   Social History   Socioeconomic History   Marital status: Single    Spouse name: Not on file   Number of children: Not on file   Years of education: Not on file   Highest education level: Not on file  Occupational History   Not on file  Tobacco Use   Smoking status: Former    Types: Cigarettes    Quit date: 1980    Years since quitting: 44.1   Smokeless tobacco: Never  Vaping Use   Vaping Use: Never used  Substance and Sexual Activity   Alcohol use: Never   Drug use: Never   Sexual activity: Not on file  Other Topics Concern   Not on file  Social History Narrative   Not on file   Social Determinants of Health   Financial Resource Strain: Not on file  Food Insecurity: Not on file  Transportation Needs: Not on file  Physical Activity: Not on file  Stress: Not on file  Social Connections: Not on file   History reviewed. No pertinent family history. Outpatient Encounter Medications as of 06/06/2022  Medication Sig   BREO ELLIPTA 200-25 MCG/ACT AEPB Inhale 1 puff into the lungs daily.   amLODipine (NORVASC) 5 MG tablet Take 5 mg by mouth once.   Apoaequorin (PREVAGEN PO) Take 1 tablet by mouth daily.   aspirin EC 81 MG tablet Take 81 mg by mouth daily. Swallow whole.   carvedilol (COREG) 6.25 MG tablet Take 6.25 mg by mouth 2 (two) times daily.   chlorthalidone (HYGROTON) 25 MG tablet Take 25 mg by mouth daily.   Cholecalciferol (VITAMIN D3) 25 MCG (1000 UT) CAPS Take 1 capsule by mouth daily in the  afternoon.   Cyanocobalamin (VITAMIN B 12) 500 MCG TABS Take 500 mg by mouth once.   Fexofenadine HCl (ALLEGRA ALLERGY PO) Take 20 mg by mouth once.   Ginkgo Biloba 40 MG TABS Take 60 mg by mouth daily in the afternoon.   Glucosamine-Chondroit-Vit C-Mn (GLUCOSAMINE 1500 COMPLEX PO) Take 2,000 mg by mouth once.   Multiple Vitamins-Minerals (HAIR SKIN AND NAILS FORMULA PO) Take 2 each by mouth daily.   omeprazole (PRILOSEC) 20 MG capsule Take 20 mg by mouth 2 (two) times a day.   potassium chloride (KLOR-CON) 10 MEQ tablet Take 20 mEq by mouth daily.   rosuvastatin (CRESTOR) 20 MG tablet Take 20 mg by mouth at bedtime.   [DISCONTINUED] Garlic (GARLIQUE PO) Take 1,000 mg by mouth once. (Patient not taking: Reported on 12/02/2020)   [DISCONTINUED] isosorbide mononitrate (IMDUR) 30 MG 24 hr tablet Take 30 mg by mouth daily. (Patient not taking: Reported on 12/02/2020)   No facility-administered encounter medications  on file as of 06/06/2022.   ALLERGIES: Allergies  Allergen Reactions   Penicillins Rash and Swelling    VACCINATION STATUS: Immunization History  Administered Date(s) Administered   Moderna Sars-Cov-2 Peds vaccine 17yrs thru 39yrs 02/08/2022    HPI Lori Ellison is 72 y.o. female who is status post I-131 thyroid ablation on October 28, 2019.  She is returning with repeat t thyroid function tests for follow-up .   She has no new complaints today.  Her previsit labs show treatment effect with thyroid hormone levels within normal limits.  She did not develop hypothyroidism following her treatment with radioactive iodine.     Prior to her therapeutic I-131, thyroid uptake and scan confirmed inhomogeneous tracer distribution in both thyroid lobes consistent with multinodular goiter.   She has  family history of  unidentified thyroid dysfunction in her daughter. -She denies any personal history of goiter nor family history of thyroid malignancy. -She denies dysphagia, shortness of breath,  nor voice change.   Review of Systems Limited as above.  Objective:    BP 132/68   Pulse (!) 56   Ht 5\' 6"  (1.676 m)   Wt 187 lb 6.4 oz (85 kg)   BMI 30.25 kg/m   Wt Readings from Last 3 Encounters:  06/06/22 187 lb 6.4 oz (85 kg)  06/06/21 183 lb 9.6 oz (83.3 kg)  12/02/20 187 lb 9.6 oz (85.1 kg)     Physical Exam- Limited     -Thyroid function test: February 25, 2018 TSH 0-0 2 5 June 24, 2018 TSH 0038, free T4 0.9, total T3 132  October 31, 2018 thyroid uptake and scan FINDINGS: Heterogeneous uptake within the thyroid gland. There are multiple warm nodules. No discrete cold nodules evident.   4 hour I-123 uptake = 16.2% (normal 5-20%).  24 hour I-123 uptake = 38.1% (normal 10-30%)   IMPRESSION: 1. Multinodular thyroid gland. 2. Moderately elevated 24 hour iodine uptake and depressed TSH. 3. Findings most with consistent toxic multinodular goiter.  Repeat thyroid uptake and scan on October 01, 2019 FINDINGS: Inhomogeneous tracer distribution in both thyroid lobes demonstrating multiple areas of increased and decreased tracer uptake consistent with a multinodular thyroid gland.   No dominant mass.   4 hour I-123 uptake = 14% (normal 5-20%)   24 hour I-123 uptake = 30% (normal 10-30%)   IMPRESSION: Multinodular thyroid gland.   Borderline elevated 24 hour radio iodine uptake of 30%, slightly decreased in a 38% on the previous exam.  Recent Results (from the past 2160 hour(s))  TSH     Status: None   Collection Time: 05/31/22  1:09 PM  Result Value Ref Range   TSH 1.220 0.450 - 4.500 uIU/mL  T4, free     Status: None   Collection Time: 05/31/22  1:09 PM  Result Value Ref Range   Free T4 1.11 0.82 - 1.77 ng/dL  T3, free     Status: None   Collection Time: 05/31/22  1:09 PM  Result Value Ref Range   T3, Free 2.6 2.0 - 4.4 pg/mL    Assessment & Plan:   1.  Toxic multinodular goiter  -Prior to her I-131 thyroid ablation, she did have T3 toxicosis.   She was previously treated with methimazole briefly.  Her previsit thyroid function tests remain consistent with euthyroid state.  She will not need initiation of thyroid hormone for now.    She will need repeat thyroid function test and office visit in 1 year.    -  I advised her  to maintain close follow up with Lonia Mad, MD for primary care needs.   I spent  21  minutes in the care of the patient today including review of labs from Thyroid Function, CMP, and other relevant labs ; imaging/biopsy records (current and previous including abstractions from other facilities); face-to-face time discussing  her lab results and symptoms, medications doses, her options of short and long term treatment based on the latest standards of care / guidelines;   and documenting the encounter.  Lori Ellison  participated in the discussions, expressed understanding, and voiced agreement with the above plans.  All questions were answered to her satisfaction. she is encouraged to contact clinic should she have any questions or concerns prior to her return visit.     Follow up plan: Return in about 1 year (around 06/07/2023) for F/U with Pre-visit Labs.   Glade Lloyd, MD [facility] [address] [place], [place] [address] Phone: [phone]  Fax: [phone]     06/06/2022, 12:25 PM  This note was partially dictated with voice recognition software. Similar sounding words can be transcribed inadequately or may not  be corrected upon review.

## 2023-05-16 ENCOUNTER — Telehealth: Payer: Self-pay | Admitting: "Endocrinology

## 2023-05-16 DIAGNOSIS — E0520 Thyrotoxicosis with toxic multinodular goiter without thyrotoxic crisis or storm: Secondary | ICD-10-CM

## 2023-05-16 NOTE — Telephone Encounter (Signed)
 Lab order updated and mailed

## 2023-05-16 NOTE — Telephone Encounter (Signed)
 Labs need to be updated and mailed

## 2023-06-07 ENCOUNTER — Ambulatory Visit: Payer: Medicare HMO | Admitting: "Endocrinology

## 2023-07-20 LAB — T3, FREE: T3, Free: 3.1 pg/mL (ref 2.0–4.4)

## 2023-07-20 LAB — TSH: TSH: 1.59 u[IU]/mL (ref 0.450–4.500)

## 2023-07-20 LAB — T4, FREE: Free T4: 1.31 ng/dL (ref 0.82–1.77)

## 2023-07-26 ENCOUNTER — Ambulatory Visit: Payer: Medicare HMO | Admitting: "Endocrinology

## 2023-07-26 ENCOUNTER — Encounter: Payer: Self-pay | Admitting: "Endocrinology

## 2023-07-26 VITALS — BP 100/72 | HR 76 | Ht 67.0 in | Wt 185.2 lb

## 2023-07-26 DIAGNOSIS — E0520 Thyrotoxicosis with toxic multinodular goiter without thyrotoxic crisis or storm: Secondary | ICD-10-CM

## 2023-07-26 NOTE — Progress Notes (Signed)
 07/26/2023, 12:47 PM  Endocrinology follow-up note                  Subjective:    Patient ID: Lori Ellison, female    DOB: 1951/01/21, PCP Arma Heading, MD   Past Medical History:  Diagnosis Date   Hypertension    Moderate persistent asthma    Spinal stenosis at L4-L5 level    Past Surgical History:  Procedure Laterality Date   BUNIONECTOMY     FOOT ARTHRODESIS, MODIFIED MCBRIDE Bilateral 2010   Social History   Socioeconomic History   Marital status: Single    Spouse name: Not on file   Number of children: Not on file   Years of education: Not on file   Highest education level: Not on file  Occupational History   Not on file  Tobacco Use   Smoking status: Former    Current packs/day: 0.00    Types: Cigarettes    Quit date: 1980    Years since quitting: 45.2   Smokeless tobacco: Never  Vaping Use   Vaping status: Never Used  Substance and Sexual Activity   Alcohol use: Never   Drug use: Never   Sexual activity: Not on file  Other Topics Concern   Not on file  Social History Narrative   Not on file   Social Drivers of Health   Financial Resource Strain: Not on file  Food Insecurity: Not on file  Transportation Needs: Not on file  Physical Activity: Not on file  Stress: Not on file  Social Connections: Not on file   History reviewed. No pertinent family history. Outpatient Encounter Medications as of 07/26/2023  Medication Sig   amLODipine (NORVASC) 5 MG tablet Take 5 mg by mouth once.   aspirin EC 81 MG tablet Take 81 mg by mouth daily. Swallow whole.   carvedilol (COREG) 6.25 MG tablet Take 6.25 mg by mouth 2 (two) times daily.   chlorthalidone (HYGROTON) 25 MG tablet Take 25 mg by mouth daily.   Cholecalciferol (VITAMIN D3) 25 MCG (1000 UT) CAPS Take 1 capsule by mouth daily in the afternoon.   Cyanocobalamin (VITAMIN B 12) 500 MCG TABS Take 500 mg by mouth once.   Fexofenadine HCl (ALLEGRA  ALLERGY PO) Take 20 mg by mouth once.   Ginkgo Biloba 40 MG TABS Take 60 mg by mouth daily in the afternoon.   Glucosamine-Chondroit-Vit C-Mn (GLUCOSAMINE 1500 COMPLEX PO) Take 2,000 mg by mouth once.   Multiple Vitamins-Minerals (HAIR SKIN AND NAILS FORMULA PO) Take 2 each by mouth daily.   omeprazole (PRILOSEC) 20 MG capsule Take 20 mg by mouth 2 (two) times a day.   potassium chloride (KLOR-CON) 10 MEQ tablet Take 20 mEq by mouth daily.   rosuvastatin (CRESTOR) 20 MG tablet Take 20 mg by mouth at bedtime.   [DISCONTINUED] Apoaequorin (PREVAGEN PO) Take 1 tablet by mouth daily.   [DISCONTINUED] BREO ELLIPTA 200-25 MCG/ACT AEPB Inhale 1 puff into the lungs daily.   No facility-administered encounter medications on file as of 07/26/2023.   ALLERGIES: Allergies  Allergen Reactions   Penicillins Rash and Swelling    VACCINATION STATUS: Immunization History  Administered Date(s) Administered   Moderna Sars-Cov-2 Peds  vaccine 60yrs thru 74yrs 02/08/2022, 01/24/2023    HPI Lori Ellison is 73 y.o. female who is status post I-131 thyroid ablation on October 28, 2019.  She is returning with repeat thyroid function test for follow-up.      She has no new complaints today.  Her previsit labs show treatment effect with thyroid hormone levels within normal limits.  She did not develop hypothyroidism following her treatment with radioactive iodine.     Prior to her therapeutic I-131, thyroid uptake and scan confirmed inhomogeneous tracer distribution in both thyroid lobes consistent with multinodular goiter.   She has  family history of  unidentified thyroid dysfunction in her daughter. -She denies any personal history of goiter nor family history of thyroid malignancy. -She denies dysphagia, shortness of breath, nor voice change.   Review of Systems Limited as above.  Objective:    BP 100/72   Pulse 76   Ht 5\' 7"  (1.702 m)   Wt 185 lb 3.2 oz (84 kg)   BMI 29.01 kg/m   Wt Readings from  Last 3 Encounters:  07/26/23 185 lb 3.2 oz (84 kg)  06/06/22 187 lb 6.4 oz (85 kg)  06/06/21 183 lb 9.6 oz (83.3 kg)     Physical Exam- Limited     -Thyroid function test: February 25, 2018 TSH 0-0 2 5 June 24, 2018 TSH 0038, free T4 0.9, total T3 132  October 31, 2018 thyroid uptake and scan FINDINGS: Heterogeneous uptake within the thyroid gland. There are multiple warm nodules. No discrete cold nodules evident.   4 hour I-123 uptake = 16.2% (normal 5-20%).  24 hour I-123 uptake = 38.1% (normal 10-30%)   IMPRESSION: 1. Multinodular thyroid gland. 2. Moderately elevated 24 hour iodine uptake and depressed TSH. 3. Findings most with consistent toxic multinodular goiter.  Repeat thyroid uptake and scan on October 01, 2019 FINDINGS: Inhomogeneous tracer distribution in both thyroid lobes demonstrating multiple areas of increased and decreased tracer uptake consistent with a multinodular thyroid gland.   No dominant mass.   4 hour I-123 uptake = 14% (normal 5-20%)   24 hour I-123 uptake = 30% (normal 10-30%)   IMPRESSION: Multinodular thyroid gland.   Borderline elevated 24 hour radio iodine uptake of 30%, slightly decreased in a 38% on the previous exam.  Recent Results (from the past 2160 hours)  T4, Free     Status: None   Collection Time: 07/19/23 11:31 AM  Result Value Ref Range   Free T4 1.31 0.82 - 1.77 ng/dL  T3, Free     Status: None   Collection Time: 07/19/23 11:31 AM  Result Value Ref Range   T3, Free 3.1 2.0 - 4.4 pg/mL  TSH     Status: None   Collection Time: 07/19/23 11:31 AM  Result Value Ref Range   TSH 1.590 0.450 - 4.500 uIU/mL    Assessment & Plan:   1.  Toxic multinodular goiter  -Prior to her I-131 thyroid ablation, she did have T3 toxicosis.  She was previously treated with methimazole briefly.  Her previsit thyroid function test are consistent with euthyroid state.  She will not need initiation with thyroid hormone at this time.    In  light of her prior history of ablative therapy, she will continue to need at least yearly evaluation for thyroid function.    She will need repeat thyroid function test and office visit in 1 year.    - I advised her  to maintain close follow  up with Arma Heading, MD for primary care needs.   I spent  21  minutes in the care of the patient today including review of labs from Thyroid Function, CMP, and other relevant labs ; imaging/biopsy records (current and previous including abstractions from other facilities); face-to-face time discussing  her lab results and symptoms, medications doses, her options of short and long term treatment based on the latest standards of care / guidelines;   and documenting the encounter.  Lori Ellison  participated in the discussions, expressed understanding, and voiced agreement with the above plans.  All questions were answered to her satisfaction. she is encouraged to contact clinic should she have any questions or concerns prior to her return visit.   Follow up plan: Return in about 1 year (around 07/25/2024) for F/U with Pre-visit Labs.   Marquis Lunch, MD [facility] [address] [place], [place] [address] Phone: [phone]  Fax: [phone]     07/26/2023, 12:47 PM  This note was partially dictated with voice recognition software. Similar sounding words can be transcribed inadequately or may not  be corrected upon review.

## 2024-07-28 ENCOUNTER — Ambulatory Visit: Admitting: "Endocrinology
